# Patient Record
Sex: Male | Born: 1945 | Hispanic: No | Marital: Married | Smoking: Never smoker
Health system: Southern US, Community
[De-identification: ages and names within clinical notes are randomized; demographics above are authoritative.]

## PROBLEM LIST (undated history)

## (undated) DIAGNOSIS — I2510 Atherosclerotic heart disease of native coronary artery without angina pectoris: Secondary | ICD-10-CM

## (undated) DIAGNOSIS — E119 Type 2 diabetes mellitus without complications: Secondary | ICD-10-CM

## (undated) DIAGNOSIS — I10 Essential (primary) hypertension: Secondary | ICD-10-CM

## (undated) DIAGNOSIS — I4891 Unspecified atrial fibrillation: Secondary | ICD-10-CM

## (undated) HISTORY — DX: Essential (primary) hypertension: I10

## (undated) HISTORY — DX: Type 2 diabetes mellitus without complications: E11.9

## (undated) HISTORY — PX: OTHER SURGICAL HISTORY: SHX169

## (undated) HISTORY — DX: Atherosclerotic heart disease of native coronary artery without angina pectoris: I25.10

## (undated) HISTORY — DX: Unspecified atrial fibrillation: I48.91

---

## 2013-01-02 ENCOUNTER — Other Ambulatory Visit: Payer: Self-pay | Admitting: Family Medicine

## 2013-03-20 ENCOUNTER — Other Ambulatory Visit: Payer: Self-pay | Admitting: Family Medicine

## 2013-04-24 ENCOUNTER — Other Ambulatory Visit: Payer: Self-pay | Admitting: Family Medicine

## 2013-05-29 ENCOUNTER — Other Ambulatory Visit: Payer: Self-pay | Admitting: Family Medicine

## 2013-06-30 ENCOUNTER — Other Ambulatory Visit: Payer: Self-pay | Admitting: *Deleted

## 2013-06-30 MED ORDER — METFORMIN HCL 1000 MG PO TABS: 1000.0000 mg | ORAL_TABLET | Freq: Two times a day (BID) | ORAL | Status: DC

## 2013-06-30 MED ORDER — ATENOLOL 50 MG PO TABS: 50.0000 mg | ORAL_TABLET | Freq: Two times a day (BID) | ORAL | Status: DC

## 2013-08-10 ENCOUNTER — Telehealth: Payer: Self-pay

## 2013-08-10 MED ORDER — SILDENAFIL CITRATE 100 MG PO TABS: ORAL_TABLET | ORAL | Status: DC

## 2013-08-10 NOTE — Telephone Encounter (Signed)
Medication sent to pharmacy. Michael Velazquez was notified.

## 2013-08-10 NOTE — Telephone Encounter (Signed)
Patient needs a refill on his viagra. [facility]

## 2013-08-10 NOTE — Telephone Encounter (Signed)
Refill times one, he needs check up of his DM etc, needs OV

## 2013-09-05 ENCOUNTER — Other Ambulatory Visit: Payer: Self-pay

## 2013-09-05 MED ORDER — LISINOPRIL-HYDROCHLOROTHIAZIDE 20-25 MG PO TABS: ORAL_TABLET | ORAL | Status: DC

## 2013-09-05 MED ORDER — ATENOLOL 50 MG PO TABS: 50.0000 mg | ORAL_TABLET | Freq: Two times a day (BID) | ORAL | Status: DC

## 2013-09-05 MED ORDER — METFORMIN HCL 1000 MG PO TABS: 1000.0000 mg | ORAL_TABLET | Freq: Two times a day (BID) | ORAL | Status: DC

## 2013-09-21 ENCOUNTER — Encounter: Payer: Self-pay | Admitting: Family Medicine

## 2013-10-05 ENCOUNTER — Encounter: Payer: Self-pay | Admitting: Family Medicine

## 2013-10-05 ENCOUNTER — Ambulatory Visit (INDEPENDENT_AMBULATORY_CARE_PROVIDER_SITE_OTHER): Payer: 59 | Admitting: Family Medicine

## 2013-10-05 VITALS — BP 188/100 | Ht 68.0 in | Wt 156.0 lb

## 2013-10-05 DIAGNOSIS — Z0000 Encounter for general adult medical examination without abnormal findings: Secondary | ICD-10-CM

## 2013-10-05 DIAGNOSIS — I10 Essential (primary) hypertension: Secondary | ICD-10-CM

## 2013-10-05 DIAGNOSIS — E119 Type 2 diabetes mellitus without complications: Secondary | ICD-10-CM | POA: Insufficient documentation

## 2013-10-05 DIAGNOSIS — E785 Hyperlipidemia, unspecified: Secondary | ICD-10-CM

## 2013-10-05 DIAGNOSIS — E781 Pure hyperglyceridemia: Secondary | ICD-10-CM

## 2013-10-05 DIAGNOSIS — Z125 Encounter for screening for malignant neoplasm of prostate: Secondary | ICD-10-CM

## 2013-10-05 DIAGNOSIS — Z79899 Other long term (current) drug therapy: Secondary | ICD-10-CM

## 2013-10-05 NOTE — Progress Notes (Signed)
   Subjective:    Patient ID: Michael Velazquez, male    DOB: 04-16-1946, 68 y.o.   MRN: [id number]  HPI Patient is here today for a wellness exam.  Concerned about his stomach. He had a stomach virus 3 weeks ago, and he says his stomach has not felt the same since then.   The patient comes in today for a wellness visit.  A review of their health history was completed.  A review of medications was also completed. Any necessary refills were discussed. Sensible healthy diet was discussed. Importance of minimizing excessive salt and carbohydrates was also discussed. Safety was stressed including driving, activities at work and at home where applicable. Importance of regular physical activity for overall health was discussed. Preventative measures appropriate for age were discussed. Time was spent with the patient discussing any concerns they have about their well-being.   Review of Systems  Constitutional: Negative for fever, activity change and appetite change.  HENT: Negative for congestion and rhinorrhea.   Eyes: Negative for discharge.  Respiratory: Negative for cough and wheezing.   Cardiovascular: Negative for chest pain.  Gastrointestinal: Negative for vomiting, abdominal pain and blood in stool.  Genitourinary: Negative for frequency and difficulty urinating.  Musculoskeletal: Negative for neck pain.  Skin: Negative for rash.  Allergic/Immunologic: Negative for environmental allergies and food allergies.  Neurological: Negative for weakness and headaches.  Psychiatric/Behavioral: Negative for agitation.       Objective:   Physical Exam  Constitutional: He appears well-developed and well-nourished.  HENT:  Head: Normocephalic and atraumatic.  Right Ear: External ear normal.  Left Ear: External ear normal.  Nose: Nose normal.  Mouth/Throat: Oropharynx is clear and moist.  Eyes: EOM are normal. Pupils are equal, round, and reactive to light.  Neck: Normal range of motion.  Neck supple. No thyromegaly present.  Cardiovascular: Normal rate, regular rhythm and normal heart sounds.   No murmur heard. Pulmonary/Chest: Effort normal and breath sounds normal. No respiratory distress. He has no wheezes.  Abdominal: Soft. Bowel sounds are normal. He exhibits no distension and no mass. There is no tenderness.  Genitourinary: Prostate normal.  Musculoskeletal: Normal range of motion. He exhibits no edema.  Lymphadenopathy:    He has no cervical adenopathy.  Neurological: He is alert. He exhibits normal muscle tone.  Skin: Skin is warm and dry. No erythema.  Psychiatric: He has a normal mood and affect. His behavior is normal. Judgment normal.          Assessment & Plan:  Colonoscopy this year Vaccines DM check ups regularly labs

## 2013-10-09 ENCOUNTER — Other Ambulatory Visit: Payer: Self-pay | Admitting: *Deleted

## 2013-10-09 MED ORDER — LISINOPRIL-HYDROCHLOROTHIAZIDE 20-25 MG PO TABS: ORAL_TABLET | ORAL | Status: DC

## 2013-10-31 ENCOUNTER — Other Ambulatory Visit: Payer: Self-pay

## 2013-10-31 MED ORDER — ATENOLOL 50 MG PO TABS: 50.0000 mg | ORAL_TABLET | Freq: Two times a day (BID) | ORAL | Status: DC

## 2013-10-31 MED ORDER — METFORMIN HCL 1000 MG PO TABS: 1000.0000 mg | ORAL_TABLET | Freq: Two times a day (BID) | ORAL | Status: DC

## 2013-11-23 ENCOUNTER — Other Ambulatory Visit: Payer: Self-pay | Admitting: Nurse Practitioner

## 2013-11-23 MED ORDER — VALACYCLOVIR HCL 1 G PO TABS: 1000.0000 mg | ORAL_TABLET | Freq: Three times a day (TID) | ORAL | Status: DC

## 2013-11-23 NOTE — Progress Notes (Signed)
Phone call from patient's wife: they are on their way to the beach. Patient woke up this AM with a line of bumps/blisters along the mid buttock. Minimally tender. Will send in Rx for valtrex for probable zoster. Office visit on Monday if no better, get seen at urgent care sooner if worse.

## 2014-08-02 ENCOUNTER — Other Ambulatory Visit: Payer: Self-pay | Admitting: Family Medicine

## 2014-08-02 MED ORDER — LISINOPRIL-HYDROCHLOROTHIAZIDE 20-25 MG PO TABS: ORAL_TABLET | ORAL | Status: DC

## 2014-08-02 NOTE — Telephone Encounter (Signed)
Requesting Rx for Lisinopril to be sent to [facility].  He will make appointment for February.

## 2014-08-02 NOTE — Telephone Encounter (Signed)
Done

## 2014-09-20 ENCOUNTER — Other Ambulatory Visit: Payer: Self-pay | Admitting: *Deleted

## 2014-09-20 ENCOUNTER — Telehealth: Payer: Self-pay | Admitting: Family Medicine

## 2014-09-20 MED ORDER — LISINOPRIL-HYDROCHLOROTHIAZIDE 20-25 MG PO TABS: ORAL_TABLET | ORAL | Status: DC

## 2014-09-20 NOTE — Telephone Encounter (Signed)
Scott to see this pt has diabetes htn and all managed by [place] and not seen for a yr

## 2014-09-20 NOTE — Telephone Encounter (Signed)
Last visit 10/05/13. Pt requesting 90 day supply

## 2014-09-20 NOTE — Telephone Encounter (Signed)
Wife states he only needs enough to get to his appt. Completely out. 30 day supply sent to pharm. Wife notified.

## 2014-09-20 NOTE — Telephone Encounter (Signed)
Patient has a follow up appt on 10/12/14.  He is in need of a refill on his lisinopril.  Can we refill this?   [facility]

## 2014-10-12 ENCOUNTER — Ambulatory Visit: Payer: 59 | Admitting: Family Medicine

## 2014-10-26 ENCOUNTER — Other Ambulatory Visit: Payer: Self-pay | Admitting: *Deleted

## 2014-10-26 MED ORDER — METFORMIN HCL 1000 MG PO TABS: 1000.0000 mg | ORAL_TABLET | Freq: Two times a day (BID) | ORAL | Status: DC

## 2014-11-01 ENCOUNTER — Other Ambulatory Visit: Payer: Self-pay | Admitting: *Deleted

## 2014-11-01 DIAGNOSIS — E785 Hyperlipidemia, unspecified: Secondary | ICD-10-CM

## 2014-11-01 DIAGNOSIS — Z125 Encounter for screening for malignant neoplasm of prostate: Secondary | ICD-10-CM

## 2014-11-01 DIAGNOSIS — E119 Type 2 diabetes mellitus without complications: Secondary | ICD-10-CM

## 2014-11-01 DIAGNOSIS — Z79899 Other long term (current) drug therapy: Secondary | ICD-10-CM

## 2014-11-02 ENCOUNTER — Ambulatory Visit (INDEPENDENT_AMBULATORY_CARE_PROVIDER_SITE_OTHER): Payer: 59 | Admitting: Family Medicine

## 2014-11-02 ENCOUNTER — Encounter: Payer: Self-pay | Admitting: Family Medicine

## 2014-11-02 VITALS — BP 188/110 | Ht 68.0 in | Wt 154.0 lb

## 2014-11-02 DIAGNOSIS — Z23 Encounter for immunization: Secondary | ICD-10-CM

## 2014-11-02 DIAGNOSIS — I10 Essential (primary) hypertension: Secondary | ICD-10-CM | POA: Diagnosis not present

## 2014-11-02 DIAGNOSIS — E781 Pure hyperglyceridemia: Secondary | ICD-10-CM

## 2014-11-02 DIAGNOSIS — E119 Type 2 diabetes mellitus without complications: Secondary | ICD-10-CM | POA: Diagnosis not present

## 2014-11-02 LAB — POCT GLYCOSYLATED HEMOGLOBIN (HGB A1C): Hemoglobin A1C: 8.6

## 2014-11-02 MED ORDER — SITAGLIPTIN PHOSPHATE 100 MG PO TABS: 100.0000 mg | ORAL_TABLET | Freq: Every day | ORAL | Status: DC

## 2014-11-02 NOTE — Patient Instructions (Addendum)
Dear Patient,  It has been recommended to you that you have a colonoscopy. It is your responsibility to carry through with this recommendation.   Did you realize that colon cancer is the second leading cancer killer in the [place]. One in every 20 adults will get colon cancer. If all adults would go through the recommended screening for colon cancer (getting a colonoscopy), then there would be a 60% reduction in the number of people dying from colon cancer.  Colon cancer just doesn't come out of the blue. It starts off as a small polyp which over time grows into a cancer. A colonoscopy can prevent cancer and in many cases detected when it is at a very treatable phase. Small colon cancers can have cure rates of 95%. Advanced colon cancer, which often occurs in people who do not do their screenings, have cure rates less than 20%. The risk of colon cancer advances with age. Most adults should have regular colonoscopies every 10 years starting at age 35. This recommendation can vary depending on a person's medical history.  Health-care laws now allow for you to call the gastroenterologist office directly in order to set yourself up for this very important tests. Today we have recommended to you that you do this test. This test may save your life. Failure to do this test puts you at risk for premature death from colon cancer. Do the right thing and schedule this test now.  Here as a list of specialists we recommend in the surrounding area. When you call their office let them know that you are a patient of our practice in your interested in doing a screening colonoscopy. They should assist you without problems. You will need the following information when you called them: 1-name of which Dr. you see, 2-your insurance information, 3-a list of medications that you currently take, 4-any allergies you have to medications.  [place] gastroenterologist Dr. Milton Ferguson, Dr Felicie Morn  gastroenterologist   [facility]   [phone]  [facility] gastroenterology (Dr. Garnetta Buddy and [place]) [phone]  [facility] gastroenterology (Dr. Leia Alf, Harrietta Guardian, [place]) [phone]  Each group of specialists has assured [place] that when you called them they will help you get your colonoscopy set up. Should you have problems please let us know. Be sure to call soon. Sincerely, Michael Velazquez, Dr Mickie Hillier, Dr.Rhett Najera Michael Velazquez    Also please do eye exam this year  Have your daughter check your BP 2 to 3 times over the next few weeks and send me those readings via Beth/you may need additional medicine    Diabetes Mellitus and Food It is important for you to manage your blood sugar (glucose) level. Your blood glucose level can be greatly affected by what you eat. Eating healthier foods in the appropriate amounts throughout the day at about the same time each day will help you control your blood glucose level. It can also help slow or prevent worsening of your diabetes mellitus. Healthy eating may even help you improve the level of your blood pressure and reach or maintain a healthy weight.  HOW CAN FOOD AFFECT ME? Carbohydrates Carbohydrates affect your blood glucose level more than any other type of food. Your dietitian will help you determine how many carbohydrates to eat at each meal and teach you how to count carbohydrates. Counting carbohydrates is important to keep your blood glucose at a healthy level, especially  if you are using insulin or taking certain medicines for diabetes mellitus. Alcohol Alcohol can cause sudden decreases in blood glucose (hypoglycemia), especially if you use insulin or take certain medicines for diabetes mellitus. Hypoglycemia can be a life-threatening condition. Symptoms of hypoglycemia (sleepiness, dizziness, and disorientation) are  similar to symptoms of having too much alcohol.  If your health care provider has given you approval to drink alcohol, do so in moderation and use the following guidelines:  Women should not have more than one drink per day, and men should not have more than two drinks per day. One drink is equal to:  12 oz of beer.  5 oz of wine.  1 oz of hard liquor.  Do not drink on an empty stomach.  Keep yourself hydrated. Have water, diet soda, or unsweetened iced tea.  Regular soda, juice, and other mixers might contain a lot of carbohydrates and should be counted. WHAT FOODS ARE NOT RECOMMENDED? As you make food choices, it is important to remember that all foods are not the same. Some foods have fewer nutrients per serving than other foods, even though they might have the same number of calories or carbohydrates. It is difficult to get your body what it needs when you eat foods with fewer nutrients. Examples of foods that you should avoid that are high in calories and carbohydrates but low in nutrients include:  Trans fats (most processed foods list trans fats on the Nutrition Facts label).  Regular soda.  Juice.  Candy.  Sweets, such as cake, pie, doughnuts, and cookies.  Fried foods. WHAT FOODS CAN I EAT? Have nutrient-rich foods, which will nourish your body and keep you healthy. The food you should eat also will depend on several factors, including:  The calories you need.  The medicines you take.  Your weight.  Your blood glucose level.  Your blood pressure level.  Your cholesterol level. You also should eat a variety of foods, including:  Protein, such as meat, poultry, fish, tofu, nuts, and seeds (lean animal proteins are best).  Fruits.  Vegetables.  Dairy products, such as milk, cheese, and yogurt (low fat is best).  Breads, grains, pasta, cereal, rice, and beans.  Fats such as olive oil, trans fat-free margarine, canola oil, avocado, and olives. DOES  EVERYONE WITH DIABETES MELLITUS HAVE THE SAME MEAL PLAN? Because every person with diabetes mellitus is different, there is not one meal plan that works for everyone. It is very important that you meet with a dietitian who will help you create a meal plan that is just right for you. Document Released: 04/09/2005 Document Revised: 07/18/2013 Document Reviewed: 06/09/2013 [facility] Patient Information 2015 [place], [place]. This information is not intended to replace advice given to you by your health care provider. Make sure you discuss any questions you have with your health care provider. Hypertension Hypertension, commonly called high blood pressure, is when the force of blood pumping through your arteries is too strong. Your arteries are the blood vessels that carry blood from your heart throughout your body. A blood pressure reading consists of a higher number over a lower number, such as 110/72. The higher number (systolic) is the pressure inside your arteries when your heart pumps. The lower number (diastolic) is the pressure inside your arteries when your heart relaxes. Ideally you want your blood pressure below 120/80. Hypertension forces your heart to work harder to pump blood. Your arteries may become narrow or stiff. Having hypertension puts you at risk for  heart disease, stroke, and other problems.  RISK FACTORS Some risk factors for high blood pressure are controllable. Others are not.  Risk factors you cannot control include:   Race. You may be at higher risk if you are African American.  Age. Risk increases with age.  Gender. Men are at higher risk than women before age 78 years. After age 93, women are at higher risk than men. Risk factors you can control include:  Not getting enough exercise or physical activity.  Being overweight.  Getting too much fat, sugar, calories, or salt in your diet.  Drinking too much alcohol. SIGNS AND SYMPTOMS Hypertension does not usually cause  signs or symptoms. Extremely high blood pressure (hypertensive crisis) may cause headache, anxiety, shortness of breath, and nosebleed. DIAGNOSIS  To check if you have hypertension, your health care provider will measure your blood pressure while you are seated, with your arm held at the level of your heart. It should be measured at least twice using the same arm. Certain conditions can cause a difference in blood pressure between your right and left arms. A blood pressure reading that is higher than normal on one occasion does not mean that you need treatment. If one blood pressure reading is high, ask your health care provider about having it checked again. TREATMENT  Treating high blood pressure includes making lifestyle changes and possibly taking medicine. Living a healthy lifestyle can help lower high blood pressure. You may need to change some of your habits. Lifestyle changes may include:  Following the DASH diet. This diet is high in fruits, vegetables, and whole grains. It is low in salt, red meat, and added sugars.  Getting at least 2 hours of brisk physical activity every week.  Losing weight if necessary.  Not smoking.  Limiting alcoholic beverages.  Learning ways to reduce stress. If lifestyle changes are not enough to get your blood pressure under control, your health care provider may prescribe medicine. You may need to take more than one. Work closely with your health care provider to understand the risks and benefits. HOME CARE INSTRUCTIONS  Have your blood pressure rechecked as directed by your health care provider.   Take medicines only as directed by your health care provider. Follow the directions carefully. Blood pressure medicines must be taken as prescribed. The medicine does not work as well when you skip doses. Skipping doses also puts you at risk for problems.   Do not smoke.   Monitor your blood pressure at home as directed by your health care  provider. SEEK MEDICAL CARE IF:   You think you are having a reaction to medicines taken.  You have recurrent headaches or feel dizzy.  You have swelling in your ankles.  You have trouble with your vision. SEEK IMMEDIATE MEDICAL CARE IF:  You develop a severe headache or confusion.  You have unusual weakness, numbness, or feel faint.  You have severe chest or abdominal pain.  You vomit repeatedly.  You have trouble breathing. MAKE SURE YOU:   Understand these instructions.  Will watch your condition.  Will get help right away if you are not doing well or get worse. Document Released: 07/13/2005 Document Revised: 11/27/2013 Document Reviewed: 05/05/2013 [facility] Patient Information 2015 [place], [place]. This information is not intended to replace advice given to you by your health care provider. Make sure you discuss any questions you have with your health care provider.

## 2014-11-02 NOTE — Progress Notes (Signed)
   Subjective:    Patient ID: Michael Velazquez, male    DOB: [birth year], 69 y.o.   MRN: [id number]  Diabetes He presents for his follow-up diabetic visit. He has type 2 diabetes mellitus. There are no hypoglycemic associated symptoms. Pertinent negatives for hypoglycemia include no confusion. Associated symptoms include blurred vision and polydipsia. Pertinent negatives for diabetes include no chest pain, no fatigue, no polyphagia and no weakness. Current diabetic treatment includes oral agent (monotherapy). He is compliant with treatment some of the time. Exercise: "active" Blood glucose monitoring compliance is poor. He does not see a podiatrist.Eye exam is not current.    Cough and chest congestion on and off since Feb.  On physical exam his lungs were clear most likely postnasal drip  Review of Systems  Constitutional: Negative for activity change, appetite change and fatigue.  HENT: Negative for congestion.   Eyes: Positive for blurred vision.  Respiratory: Negative for cough.   Cardiovascular: Negative for chest pain.  Gastrointestinal: Negative for abdominal pain.  Endocrine: Positive for polydipsia. Negative for polyphagia.  Genitourinary: Positive for frequency.  Neurological: Negative for weakness.  Psychiatric/Behavioral: Negative for confusion.       Objective:   Physical Exam  Constitutional: He appears well-nourished. No distress.  Cardiovascular: Normal rate, regular rhythm and normal heart sounds.   No murmur heard. Pulmonary/Chest: Effort normal and breath sounds normal. No respiratory distress.  Musculoskeletal: He exhibits no edema.  Lymphadenopathy:    He has no cervical adenopathy.  Neurological: He is alert.  Psychiatric: His behavior is normal.  Vitals reviewed.         Assessment & Plan:  Complex issue for this patient he does not do a good job at taking good care of himself he does not come to the doctor on a regular basis I encouraged him to do a  better job he needs to get his sugar under better control  We will add Januvia  In addition to this he will follow-up in 3 months to check an A1c. He will do a better job watching how he eats and taking his medicine and being compliant  I highly recommended colonoscopy  He also drew lab work today await the results may end up needing to be on statin and other medications including 81 mg aspirin but we await the results of the tests first. I did tell the patient that failure to follow through on doing these types of things significantly increases his risk for heart disease and early death greater and 25 minutes spent with patient underwent 214

## 2014-11-03 LAB — BASIC METABOLIC PANEL
BUN/Creatinine Ratio: 19 (ref 10–22)
BUN: 19 mg/dL (ref 8–27)
CHLORIDE: 94 mmol/L — AB (ref 97–108)
CO2: 25 mmol/L (ref 18–29)
CREATININE: 0.99 mg/dL (ref 0.76–1.27)
Calcium: 9.6 mg/dL (ref 8.6–10.2)
GFR calc Af Amer: 91 mL/min/{1.73_m2} (ref >59)
GFR, EST NON AFRICAN AMERICAN: 78 mL/min/{1.73_m2} (ref >59)
Glucose: 316 mg/dL — ABNORMAL HIGH (ref 65–99)
POTASSIUM: 4.4 mmol/L (ref 3.5–5.2)
SODIUM: 136 mmol/L (ref 134–144)

## 2014-11-03 LAB — HEPATIC FUNCTION PANEL
ALK PHOS: 48 IU/L (ref 39–117)
ALT: 22 IU/L (ref 0–44)
AST: 16 IU/L (ref 0–40)
Albumin: 4.5 g/dL (ref 3.6–4.8)
BILIRUBIN TOTAL: 0.8 mg/dL (ref 0.0–1.2)
Bilirubin, Direct: 0.21 mg/dL (ref 0.00–0.40)
Total Protein: 7.1 g/dL (ref 6.0–8.5)

## 2014-11-03 LAB — MICROALBUMIN, URINE: Microalbumin, Urine: 1043 ug/mL — ABNORMAL HIGH (ref 0.0–17.0)

## 2014-11-03 LAB — LIPID PANEL
Chol/HDL Ratio: 4.3 ratio units (ref 0.0–5.0)
Cholesterol, Total: 190 mg/dL (ref 100–199)
HDL: 44 mg/dL (ref >39)
LDL Calculated: 116 mg/dL — ABNORMAL HIGH (ref 0–99)
TRIGLYCERIDES: 151 mg/dL — AB (ref 0–149)
VLDL Cholesterol Cal: 30 mg/dL (ref 5–40)

## 2014-11-03 LAB — PSA: PSA: 0.4 ng/mL (ref 0.0–4.0)

## 2014-11-04 ENCOUNTER — Encounter: Payer: Self-pay | Admitting: Family Medicine

## 2014-11-04 DIAGNOSIS — E1121 Type 2 diabetes mellitus with diabetic nephropathy: Secondary | ICD-10-CM | POA: Insufficient documentation

## 2014-11-04 DIAGNOSIS — E785 Hyperlipidemia, unspecified: Secondary | ICD-10-CM | POA: Insufficient documentation

## 2014-11-05 ENCOUNTER — Other Ambulatory Visit: Payer: Self-pay | Admitting: *Deleted

## 2014-11-05 ENCOUNTER — Other Ambulatory Visit: Payer: Self-pay

## 2014-11-05 MED ORDER — ATENOLOL 50 MG PO TABS: 50.0000 mg | ORAL_TABLET | Freq: Two times a day (BID) | ORAL | Status: DC

## 2014-11-05 MED ORDER — LISINOPRIL-HYDROCHLOROTHIAZIDE 20-25 MG PO TABS: ORAL_TABLET | ORAL | Status: DC

## 2014-11-05 MED ORDER — ATORVASTATIN CALCIUM 10 MG PO TABS: 10.0000 mg | ORAL_TABLET | Freq: Every day | ORAL | Status: DC

## 2015-02-01 ENCOUNTER — Ambulatory Visit: Payer: 59 | Admitting: Family Medicine

## 2015-03-08 ENCOUNTER — Ambulatory Visit (INDEPENDENT_AMBULATORY_CARE_PROVIDER_SITE_OTHER): Payer: 59 | Admitting: Family Medicine

## 2015-03-08 ENCOUNTER — Encounter: Payer: Self-pay | Admitting: Family Medicine

## 2015-03-08 VITALS — BP 142/86 | Ht 68.0 in | Wt 156.6 lb

## 2015-03-08 DIAGNOSIS — E785 Hyperlipidemia, unspecified: Secondary | ICD-10-CM

## 2015-03-08 DIAGNOSIS — E119 Type 2 diabetes mellitus without complications: Secondary | ICD-10-CM | POA: Diagnosis not present

## 2015-03-08 DIAGNOSIS — E1121 Type 2 diabetes mellitus with diabetic nephropathy: Secondary | ICD-10-CM

## 2015-03-08 DIAGNOSIS — E1129 Type 2 diabetes mellitus with other diabetic kidney complication: Secondary | ICD-10-CM | POA: Diagnosis not present

## 2015-03-08 DIAGNOSIS — I10 Essential (primary) hypertension: Secondary | ICD-10-CM

## 2015-03-08 LAB — POCT GLYCOSYLATED HEMOGLOBIN (HGB A1C): Hemoglobin A1C: 6.5

## 2015-03-08 MED ORDER — AMLODIPINE BESYLATE 2.5 MG PO TABS: 2.5000 mg | ORAL_TABLET | Freq: Every day | ORAL | Status: DC

## 2015-03-08 MED ORDER — ATORVASTATIN CALCIUM 10 MG PO TABS: 10.0000 mg | ORAL_TABLET | Freq: Every day | ORAL | Status: DC

## 2015-03-08 MED ORDER — LISINOPRIL-HYDROCHLOROTHIAZIDE 20-25 MG PO TABS: ORAL_TABLET | ORAL | Status: DC

## 2015-03-08 MED ORDER — METFORMIN HCL 1000 MG PO TABS: 1000.0000 mg | ORAL_TABLET | Freq: Two times a day (BID) | ORAL | Status: DC

## 2015-03-08 NOTE — Progress Notes (Signed)
   Subjective:    Patient ID: Michael Velazquez, male    DOB: 09/15/1945, 70 y.o.   MRN: [id number]  Diabetes He presents for his follow-up diabetic visit. He has type 2 diabetes mellitus. Risk factors for coronary artery disease include dyslipidemia, diabetes mellitus and hypertension. Current diabetic treatment includes oral agent (dual therapy). He is compliant with treatment all of the time. He has not had a previous visit with a dietitian. He does not see a podiatrist.Eye exam is not current.   Patient has not done colonoscopy the rationale to get it done was discussed Patient was told to see eye doctor on a regular basis Patient was counseled regarding proper eating watching fats in diet keep sugars under good control taking medications Patient relates he is taking his blood pressure medicine on a regular basis try to watch and be more active   Review of Systems    patient denies any chest tightness pressure pain denies rectal bleeding denies dysphagia. Denies any chest discomfort no pressure pain. Denies excessive thirst or urination Objective:   Physical Exam  Lungs clear heart regular pulse normal BP mildly elevated abdomen soft extremities no edema diabetic foot exam normal      Assessment & Plan:  1. Diabetes mellitus without complication Much better control continue diet measures continue medications - POCT glycosylated hemoglobin (Hb A1C)  2. Essential hypertension Blood pressure subpar add amlodipine 2.5 mg daily - Hepatic function panel  3. Type 2 diabetes mellitus without complication See above  4. Diabetic nephropathy with proteinuria Importance keeping blood pressure under good control ideally shooting for 120/70 [place] was discussed  5. Hyperlipidemia History hyperlipidemia continue medication check lipid profile await the results  Encourage patient to get colonoscopy Greater than 25 minutes spent with patient discussing multiple health issues - Lipid  panel

## 2015-03-09 LAB — HEPATIC FUNCTION PANEL
ALT: 21 IU/L (ref 0–44)
AST: 18 IU/L (ref 0–40)
Albumin: 4.3 g/dL (ref 3.6–4.8)
Alkaline Phosphatase: 37 IU/L — ABNORMAL LOW (ref 39–117)
Bilirubin Total: 0.9 mg/dL (ref 0.0–1.2)
Bilirubin, Direct: 0.24 mg/dL (ref 0.00–0.40)
Total Protein: 7.1 g/dL (ref 6.0–8.5)

## 2015-03-09 LAB — LIPID PANEL
CHOL/HDL RATIO: 2.9 ratio (ref 0.0–5.0)
CHOLESTEROL TOTAL: 111 mg/dL (ref 100–199)
HDL: 38 mg/dL — ABNORMAL LOW (ref >39)
LDL CALC: 54 mg/dL (ref 0–99)
Triglycerides: 95 mg/dL (ref 0–149)
VLDL Cholesterol Cal: 19 mg/dL (ref 5–40)

## 2015-03-10 ENCOUNTER — Encounter: Payer: Self-pay | Admitting: Family Medicine

## 2015-07-04 ENCOUNTER — Other Ambulatory Visit: Payer: Self-pay

## 2015-07-04 DIAGNOSIS — E119 Type 2 diabetes mellitus without complications: Secondary | ICD-10-CM

## 2015-07-04 DIAGNOSIS — Z79899 Other long term (current) drug therapy: Secondary | ICD-10-CM

## 2015-07-04 DIAGNOSIS — E785 Hyperlipidemia, unspecified: Secondary | ICD-10-CM

## 2015-07-04 DIAGNOSIS — Z125 Encounter for screening for malignant neoplasm of prostate: Secondary | ICD-10-CM

## 2015-07-05 ENCOUNTER — Encounter: Payer: Self-pay | Admitting: Family Medicine

## 2015-07-05 ENCOUNTER — Ambulatory Visit (INDEPENDENT_AMBULATORY_CARE_PROVIDER_SITE_OTHER): Payer: 59 | Admitting: Family Medicine

## 2015-07-05 VITALS — BP 180/100 | Ht 68.0 in | Wt 157.0 lb

## 2015-07-05 DIAGNOSIS — E119 Type 2 diabetes mellitus without complications: Secondary | ICD-10-CM

## 2015-07-05 DIAGNOSIS — E785 Hyperlipidemia, unspecified: Secondary | ICD-10-CM | POA: Diagnosis not present

## 2015-07-05 DIAGNOSIS — I10 Essential (primary) hypertension: Secondary | ICD-10-CM

## 2015-07-05 DIAGNOSIS — Z23 Encounter for immunization: Secondary | ICD-10-CM | POA: Diagnosis not present

## 2015-07-05 MED ORDER — AMLODIPINE BESYLATE 5 MG PO TABS: 5.0000 mg | ORAL_TABLET | Freq: Every day | ORAL | Status: DC

## 2015-07-05 NOTE — Progress Notes (Signed)
   Subjective:    Patient ID: Michael Velazquez, male    DOB: August 14, 1945, 69 y.o.   MRN: [id number]  Diabetes He presents for his follow-up diabetic visit. He has type 2 diabetes mellitus. Pertinent negatives for hypoglycemia include no confusion. Pertinent negatives for diabetes include no chest pain, no fatigue, no polydipsia, no polyphagia and no weakness. He is compliant with treatment all of the time. He is following a generally healthy diet. Exercise: stays active, works in the yard all the time. His breakfast blood glucose range is generally 140-180 mg/dl. He does not see a podiatrist.Eye exam is not current (has appt in 2 weeks).   A1C done on bloodwork this am.   Flu vaccine.   Patient denies chest tightness pressure pain he wrote relates compliance with his medications does take his medicines on a regular basis. 81 mg aspirin added today. Risk of bleeding was discussed.  Review of Systems  Constitutional: Negative for activity change, appetite change and fatigue.  HENT: Negative for congestion.   Respiratory: Negative for cough.   Cardiovascular: Negative for chest pain.  Gastrointestinal: Negative for abdominal pain.  Endocrine: Negative for polydipsia and polyphagia.  Neurological: Negative for weakness.  Psychiatric/Behavioral: Negative for confusion.       Objective:   Physical Exam  Constitutional: He appears well-nourished. No distress.  Cardiovascular: Normal rate, regular rhythm and normal heart sounds.   No murmur heard. Pulmonary/Chest: Effort normal and breath sounds normal. No respiratory distress.  Musculoskeletal: He exhibits no edema.  Lymphadenopathy:    He has no cervical adenopathy.  Neurological: He is alert.  Psychiatric: His behavior is normal.  Vitals reviewed.         Assessment & Plan:  Diabetes-await the results of his recent blood work.   Elevated blood pressure increase amlodipine his daughter will check his blood pressure  periodically her send [place] readings.  Colonoscopy recommended that patient has a lot of other preventative measures coming up in then he hopes to be able to get that completed by early next year

## 2015-07-05 NOTE — Patient Instructions (Signed)
Aspirin and Your Heart  Aspirin is a medicine that affects the way blood clots. Aspirin can be used to help reduce the risk of blood clots, heart attacks, and other heart-related problems.  SHOULD I TAKE ASPIRIN? Your health care provider will help you determine whether it is safe and beneficial for you to take aspirin daily. Taking aspirin daily may be beneficial if you:  Have had a heart attack or chest pain.  Have undergone open heart surgery such as coronary artery bypass surgery (CABG).  Have had coronary angioplasty.  Have experienced a stroke or transient ischemic attack (TIA).  Have peripheral vascular disease (PVD).  Have chronic heart rhythm problems such as atrial fibrillation. ARE THERE ANY RISKS OF TAKING ASPIRIN DAILY? Daily use of aspirin can increase your risk of side effects. Some of these include:  Bleeding. Bleeding problems can be minor or serious. An example of a minor problem is a cut that does not stop bleeding. An example of a more serious problem is stomach bleeding or bleeding into the brain. Your risk of bleeding is increased if you are also taking non-steroidal anti-inflammatory medicine (NSAIDs).  Increased bruising.  Upset stomach.  An allergic reaction. People who have nasal polyps have an increased risk of developing an aspirin allergy. WHAT ARE SOME GUIDELINES I SHOULD FOLLOW WHEN TAKING ASPIRIN?   Take aspirin only as directed by your health care provider. Make sure you understand how much you should take and what form you should take. The two forms of aspirin are:  Non-enteric-coated. This type of aspirin does not have a coating and is absorbed quickly. Non-enteric-coated aspirin is usually recommended for people with chest pain. This type of aspirin also comes in a chewable form.  Enteric-coated. This type of aspirin has a special coating that releases the medicine very slowly. Enteric-coated aspirin causes less stomach upset than non-enteric-coated  aspirin. This type of aspirin should not be chewed or crushed.  Drink alcohol in moderation. Drinking alcohol increases your risk of bleeding. WHEN SHOULD I SEEK MEDICAL CARE?   You have unusual bleeding or bruising.  You have stomach pain.  You have an allergic reaction. Symptoms of an allergic reaction include:  Hives.  Itchy skin.  Swelling of the lips, tongue, or face.  You have ringing in your ears. WHEN SHOULD I SEEK IMMEDIATE MEDICAL CARE?   Your bowel movements are bloody, dark red, or black in color.  You vomit or cough up blood.  You have blood in your urine.  You cough, wheeze, or feel short of breath. If you have any of the following symptoms, this is an emergency. Do not wait to see if the pain will go away. Get medical help at once. Call your local emergency services (911 in the [place] Do not drive yourself to the hospital.  You have severe chest pain, especially if the pain is crushing or pressure-like and spreads to the arms, back, neck, or jaw.  You have stroke-like symptoms, such as:   Loss of vision.   Difficulty talking.   Numbness or weakness on one side of your body.   Numbness or weakness in your arm or leg.   Not thinking clearly or feeling confused.    This information is not intended to replace advice given to you by your health care provider. Make sure you discuss any questions you have with your health care provider.   Document Released: 06/25/2008 Document Revised: 08/03/2014 Document Reviewed: 10/18/2013 Elsevier Interactive Patient Education 2016 Elsevier   Inc.  

## 2015-07-06 LAB — HEPATIC FUNCTION PANEL
ALBUMIN: 4.3 g/dL (ref 3.6–4.8)
ALT: 22 IU/L (ref 0–44)
AST: 18 IU/L (ref 0–40)
Alkaline Phosphatase: 42 IU/L (ref 39–117)
Bilirubin Total: 0.9 mg/dL (ref 0.0–1.2)
Bilirubin, Direct: 0.25 mg/dL (ref 0.00–0.40)
Total Protein: 6.9 g/dL (ref 6.0–8.5)

## 2015-07-06 LAB — BASIC METABOLIC PANEL
BUN / CREAT RATIO: 22 (ref 10–22)
BUN: 20 mg/dL (ref 8–27)
CALCIUM: 9.7 mg/dL (ref 8.6–10.2)
CHLORIDE: 96 mmol/L — AB (ref 97–106)
CO2: 26 mmol/L (ref 18–29)
Creatinine, Ser: 0.92 mg/dL (ref 0.76–1.27)
GFR calc Af Amer: 98 mL/min/{1.73_m2} (ref >59)
GFR calc non Af Amer: 85 mL/min/{1.73_m2} (ref >59)
Glucose: 231 mg/dL — ABNORMAL HIGH (ref 65–99)
POTASSIUM: 4.5 mmol/L (ref 3.5–5.2)
Sodium: 137 mmol/L (ref 136–144)

## 2015-07-06 LAB — LIPID PANEL
CHOLESTEROL TOTAL: 112 mg/dL (ref 100–199)
Chol/HDL Ratio: 3.1 ratio units (ref 0.0–5.0)
HDL: 36 mg/dL — ABNORMAL LOW (ref >39)
LDL Calculated: 59 mg/dL (ref 0–99)
Triglycerides: 85 mg/dL (ref 0–149)
VLDL CHOLESTEROL CAL: 17 mg/dL (ref 5–40)

## 2015-07-06 LAB — HEMOGLOBIN A1C
Est. average glucose Bld gHb Est-mCnc: 206 mg/dL
Hgb A1c MFr Bld: 8.8 % — ABNORMAL HIGH (ref 4.8–5.6)

## 2015-07-06 LAB — PSA: PROSTATE SPECIFIC AG, SERUM: 0.3 ng/mL (ref 0.0–4.0)

## 2015-07-16 LAB — HM DIABETES EYE EXAM

## 2015-07-19 ENCOUNTER — Encounter: Payer: Self-pay | Admitting: *Deleted

## 2015-10-16 MED FILL — ATORVASTATIN 10 MG TABLET: 10 | 90 days supply | Qty: 90 | Fill #1

## 2015-10-16 MED FILL — AMLODIPINE BESYLATE 5 MG TA: 5 | 90 days supply | Qty: 90 | Fill #1

## 2015-10-31 ENCOUNTER — Other Ambulatory Visit: Payer: Self-pay | Admitting: Family Medicine

## 2015-10-31 MED FILL — LISINOPRIL-HCTZ 20-25 MG TA: 20-25 | 90 days supply | Qty: 90 | Fill #2

## 2015-10-31 MED FILL — JANUVIA 100 MG TABLET: 100 | 30 days supply | Qty: 30 | Fill #0

## 2015-11-29 ENCOUNTER — Ambulatory Visit: Payer: 59 | Admitting: Family Medicine

## 2015-12-09 ENCOUNTER — Telehealth: Payer: Self-pay | Admitting: Family Medicine

## 2015-12-09 MED ORDER — ATENOLOL 50 MG PO TABS: 50.0000 mg | ORAL_TABLET | Freq: Two times a day (BID) | ORAL | Status: DC

## 2015-12-09 MED ORDER — METFORMIN HCL 1000 MG PO TABS: 1000.0000 mg | ORAL_TABLET | Freq: Two times a day (BID) | ORAL | Status: DC

## 2015-12-09 NOTE — Telephone Encounter (Signed)
Requesting Rx for Metformin and Atenolol 50 mg.    [facility]  Patient has a followup appt on 12/24/15.

## 2015-12-09 NOTE — Telephone Encounter (Signed)
Rx sent electronically to pharmacy. Patient notified. 

## 2015-12-20 ENCOUNTER — Telehealth: Payer: Self-pay | Admitting: Family Medicine

## 2015-12-20 NOTE — Telephone Encounter (Signed)
Notified Beth that a 90 day supply of this med was sent in on 12/09/15 to [facility].

## 2015-12-20 NOTE — Telephone Encounter (Signed)
atenolol (TENORMIN) 50 MG tablet  Please send refill to [facility], he is out as of today He does have an appt on Tuesday next week for his check up

## 2015-12-24 ENCOUNTER — Encounter: Payer: Self-pay | Admitting: Family Medicine

## 2015-12-24 ENCOUNTER — Ambulatory Visit (INDEPENDENT_AMBULATORY_CARE_PROVIDER_SITE_OTHER): Payer: Medicare Other | Admitting: Family Medicine

## 2015-12-24 VITALS — BP 136/86 | Ht 68.0 in | Wt 157.4 lb

## 2015-12-24 DIAGNOSIS — E119 Type 2 diabetes mellitus without complications: Secondary | ICD-10-CM

## 2015-12-24 DIAGNOSIS — I10 Essential (primary) hypertension: Secondary | ICD-10-CM | POA: Diagnosis not present

## 2015-12-24 DIAGNOSIS — L570 Actinic keratosis: Secondary | ICD-10-CM

## 2015-12-24 DIAGNOSIS — E1121 Type 2 diabetes mellitus with diabetic nephropathy: Secondary | ICD-10-CM | POA: Diagnosis not present

## 2015-12-24 DIAGNOSIS — E785 Hyperlipidemia, unspecified: Secondary | ICD-10-CM

## 2015-12-24 LAB — POCT GLYCOSYLATED HEMOGLOBIN (HGB A1C): Hemoglobin A1C: 8.2

## 2015-12-24 MED ORDER — SITAGLIPTIN PHOSPHATE 100 MG PO TABS: 100.0000 mg | ORAL_TABLET | Freq: Every day | ORAL | Status: DC

## 2015-12-24 MED ORDER — ATENOLOL 50 MG PO TABS: 50.0000 mg | ORAL_TABLET | Freq: Two times a day (BID) | ORAL | Status: DC

## 2015-12-24 MED ORDER — LISINOPRIL-HYDROCHLOROTHIAZIDE 20-25 MG PO TABS: ORAL_TABLET | ORAL | Status: DC

## 2015-12-24 MED ORDER — AMLODIPINE BESYLATE 5 MG PO TABS: 5.0000 mg | ORAL_TABLET | Freq: Every day | ORAL | Status: DC

## 2015-12-24 MED ORDER — METFORMIN HCL 1000 MG PO TABS: 1000.0000 mg | ORAL_TABLET | Freq: Two times a day (BID) | ORAL | Status: DC

## 2015-12-24 MED ORDER — ATORVASTATIN CALCIUM 10 MG PO TABS: 10.0000 mg | ORAL_TABLET | Freq: Every day | ORAL | Status: DC

## 2015-12-24 MED ORDER — GLIPIZIDE ER 2.5 MG PO TB24: 2.5000 mg | ORAL_TABLET | Freq: Every day | ORAL | Status: DC

## 2015-12-24 NOTE — Progress Notes (Signed)
Subjective:    Patient ID: Michael Velazquez, male    DOB: 02/01/1946, 70 y.o.   MRN: [id number]  Diabetes He presents for his follow-up diabetic visit. He has type 2 diabetes mellitus. There are no hypoglycemic associated symptoms. Pertinent negatives for hypoglycemia include no confusion. There are no diabetic associated symptoms. Pertinent negatives for diabetes include no chest pain, no fatigue, no polydipsia, no polyphagia and no weakness. There are no hypoglycemic complications. There are no diabetic complications. There are no known risk factors for coronary artery disease. Current diabetic treatment includes oral agent (dual therapy). He is compliant with treatment all of the time.   Results for orders placed or performed in visit on 12/24/15  POCT glycosylated hemoglobin (Hb A1C)  Result Value Ref Range   Hemoglobin A1C 8.2     Patient states that he has a scab on the top of his head. Onset about 2-3 weeks ago. Treatments tried: none.  Medications were reviewed the importance of compliance discussed importance of better dietary measures talked about as well as improving physical activity Patient denies any cardiac symptoms.  Review of Systems  Constitutional: Negative for activity change, appetite change and fatigue.  HENT: Negative for congestion.   Respiratory: Negative for cough.   Cardiovascular: Negative for chest pain.  Gastrointestinal: Negative for abdominal pain.  Endocrine: Negative for polydipsia and polyphagia.  Neurological: Negative for weakness.  Psychiatric/Behavioral: Negative for confusion.       Objective:   Physical Exam  Constitutional: He appears well-nourished. No distress.  Cardiovascular: Normal rate, regular rhythm and normal heart sounds.   No murmur heard. Pulmonary/Chest: Effort normal and breath sounds normal. No respiratory distress.  Musculoskeletal: He exhibits no edema.  Lymphadenopathy:    He has no cervical adenopathy.    Neurological: He is alert.  Psychiatric: His behavior is normal.  Vitals reviewed.   Patient has what appears to be actinic keratosis on top of his head Diabetic foot exam completed      Assessment & Plan:  1. Type 2 diabetes mellitus without complication, without long-term current use of insulin ([place]) The patient was seen today as part of a comprehensive visit for diabetes. The importance of keeping her A1c at or below 7 was discussed. Importance of regular physical activity was discussed. Proper monitoring of glucose levels with glucometer discussed. The importance of adherence to medication as well as a controlled low starch/sugar diet was also discussed. Also discussion regarding the importance of diabetic foot checks including self check every day. Also yearly diabetic eye exams recommended. The importance of keeping blood pressure under control and keeping LDL below 100 was also discussed. Also the importance of avoiding smoking. Standard follow-up visit recommended. Finally failure to follow good diabetic measures including self effort and compliance with recommendations can certainly increase the risk of heart disease strokes kidney failure blindness loss of limb and early death was discussed with the patient. A1c needs to be better we will add glipizide 2.5 mg the cost the Januvia is difficult but they are managing currently recheck lab work in the yearly fall with A1c - POCT glycosylated hemoglobin (Hb A1C) - Lipid panel - Hepatic function panel - Basic metabolic panel  2. Essential hypertension HTN- Patient was seen today as part of a visit regarding hypertension. The importance of healthy diet and regular physical activity was discussed. The importance of compliance with medications discussed. Ideal goal is to keep blood pressure low elevated levels certainly below [id number] when possible. The  patient was counseled that keeping blood pressure under control lessen his risk of heart  attack, stroke, kidney failure, and early death. The importance of regular follow-ups was discussed with the patient. Low-salt diet such as DASH recommended. Regular physical activity was recommended as well. Patient was advised to keep regular follow-ups. Blood pressure good control today regular physical activity recommended - Lipid panel - Hepatic function panel - Basic metabolic panel  3. Hyperlipidemia The patient was seen today as part of an evaluation regarding hyperlipidemia. Recent lab work has been reviewed with the patient as well as the goals for good cholesterol care. In addition to this medications have been discussed the importance of compliance with diet and medications discussed as well. Patient has been informed of potential side effects of medications in the importance to notify us should any problems occur. Finally the patient is aware that poor control of cholesterol, noncompliance can dramatically increase her risk of heart attack strokes and premature death. The patient will keep regular office visits and the patient does agreed to periodic lab work. Lab work ordered previous lab work results reviewed - Lipid panel - Hepatic function panel - Basic metabolic panel  4. Diabetic nephropathy with proteinuria (HCC) Importance keeping blood pressure under good control on getting A1c down discuss - Lipid panel - Hepatic function panel - Basic metabolic panel  5. Actinic keratosis Referral to dermatology for possible freezing or biopsy - Lipid panel - Hepatic function panel - Basic metabolic panel - Ambulatory referral to Dermatology

## 2015-12-25 ENCOUNTER — Encounter: Payer: Self-pay | Admitting: Family Medicine

## 2015-12-25 LAB — BASIC METABOLIC PANEL
BUN / CREAT RATIO: 23 (ref 10–24)
BUN: 27 mg/dL (ref 8–27)
CALCIUM: 10 mg/dL (ref 8.6–10.2)
CHLORIDE: 95 mmol/L — AB (ref 96–106)
CO2: 25 mmol/L (ref 18–29)
CREATININE: 1.16 mg/dL (ref 0.76–1.27)
GFR, EST AFRICAN AMERICAN: 74 mL/min/{1.73_m2} (ref >59)
GFR, EST NON AFRICAN AMERICAN: 64 mL/min/{1.73_m2} (ref >59)
Glucose: 277 mg/dL — ABNORMAL HIGH (ref 65–99)
Potassium: 4.7 mmol/L (ref 3.5–5.2)
Sodium: 138 mmol/L (ref 134–144)

## 2015-12-25 LAB — HEPATIC FUNCTION PANEL
ALBUMIN: 4.4 g/dL (ref 3.6–4.8)
ALT: 20 IU/L (ref 0–44)
AST: 22 IU/L (ref 0–40)
Alkaline Phosphatase: 45 IU/L (ref 39–117)
BILIRUBIN TOTAL: 0.7 mg/dL (ref 0.0–1.2)
BILIRUBIN, DIRECT: 0.24 mg/dL (ref 0.00–0.40)
TOTAL PROTEIN: 7.3 g/dL (ref 6.0–8.5)

## 2015-12-25 LAB — LIPID PANEL
CHOL/HDL RATIO: 3.1 ratio (ref 0.0–5.0)
Cholesterol, Total: 117 mg/dL (ref 100–199)
HDL: 38 mg/dL — ABNORMAL LOW (ref >39)
LDL CALC: 55 mg/dL (ref 0–99)
Triglycerides: 120 mg/dL (ref 0–149)
VLDL Cholesterol Cal: 24 mg/dL (ref 5–40)

## 2016-01-07 DIAGNOSIS — L570 Actinic keratosis: Secondary | ICD-10-CM | POA: Diagnosis not present

## 2016-01-07 DIAGNOSIS — D044 Carcinoma in situ of skin of scalp and neck: Secondary | ICD-10-CM | POA: Diagnosis not present

## 2016-01-07 DIAGNOSIS — D0439 Carcinoma in situ of skin of other parts of face: Secondary | ICD-10-CM | POA: Diagnosis not present

## 2016-03-05 DIAGNOSIS — D0439 Carcinoma in situ of skin of other parts of face: Secondary | ICD-10-CM | POA: Diagnosis not present

## 2016-03-05 DIAGNOSIS — D044 Carcinoma in situ of skin of scalp and neck: Secondary | ICD-10-CM | POA: Diagnosis not present

## 2016-05-21 ENCOUNTER — Ambulatory Visit: Payer: Medicare Other | Admitting: Family Medicine

## 2016-05-28 DIAGNOSIS — T63441A Toxic effect of venom of bees, accidental (unintentional), initial encounter: Secondary | ICD-10-CM | POA: Diagnosis not present

## 2016-05-28 DIAGNOSIS — L500 Allergic urticaria: Secondary | ICD-10-CM | POA: Diagnosis not present

## 2016-05-28 DIAGNOSIS — R0602 Shortness of breath: Secondary | ICD-10-CM | POA: Diagnosis not present

## 2016-05-28 DIAGNOSIS — R0902 Hypoxemia: Secondary | ICD-10-CM | POA: Diagnosis not present

## 2016-05-28 DIAGNOSIS — T782XXA Anaphylactic shock, unspecified, initial encounter: Secondary | ICD-10-CM | POA: Diagnosis not present

## 2016-06-24 ENCOUNTER — Encounter: Payer: Self-pay | Admitting: Family Medicine

## 2016-06-24 ENCOUNTER — Ambulatory Visit (INDEPENDENT_AMBULATORY_CARE_PROVIDER_SITE_OTHER): Payer: Medicare Other | Admitting: Family Medicine

## 2016-06-24 VITALS — BP 148/92 | Ht 68.0 in | Wt 158.4 lb

## 2016-06-24 DIAGNOSIS — E119 Type 2 diabetes mellitus without complications: Secondary | ICD-10-CM | POA: Diagnosis not present

## 2016-06-24 DIAGNOSIS — E784 Other hyperlipidemia: Secondary | ICD-10-CM

## 2016-06-24 DIAGNOSIS — Z125 Encounter for screening for malignant neoplasm of prostate: Secondary | ICD-10-CM

## 2016-06-24 DIAGNOSIS — I10 Essential (primary) hypertension: Secondary | ICD-10-CM | POA: Diagnosis not present

## 2016-06-24 DIAGNOSIS — S40011A Contusion of right shoulder, initial encounter: Secondary | ICD-10-CM | POA: Diagnosis not present

## 2016-06-24 DIAGNOSIS — Z23 Encounter for immunization: Secondary | ICD-10-CM | POA: Diagnosis not present

## 2016-06-24 DIAGNOSIS — E7849 Other hyperlipidemia: Secondary | ICD-10-CM

## 2016-06-24 LAB — POCT GLYCOSYLATED HEMOGLOBIN (HGB A1C): Hemoglobin A1C: 6.9

## 2016-06-24 MED ORDER — GLIPIZIDE ER 2.5 MG PO TB24: 2.5000 mg | ORAL_TABLET | Freq: Every day | ORAL | 1 refills | Status: DC

## 2016-06-24 MED ORDER — LISINOPRIL-HYDROCHLOROTHIAZIDE 20-25 MG PO TABS: ORAL_TABLET | ORAL | 2 refills | Status: DC

## 2016-06-24 MED ORDER — AMLODIPINE BESYLATE 5 MG PO TABS: 5.0000 mg | ORAL_TABLET | Freq: Every day | ORAL | 1 refills | Status: DC

## 2016-06-24 MED ORDER — SITAGLIPTIN PHOSPHATE 100 MG PO TABS: 100.0000 mg | ORAL_TABLET | Freq: Every day | ORAL | 1 refills | Status: DC

## 2016-06-24 MED ORDER — METFORMIN HCL 1000 MG PO TABS: 1000.0000 mg | ORAL_TABLET | Freq: Two times a day (BID) | ORAL | 1 refills | Status: DC

## 2016-06-24 MED ORDER — ATENOLOL 50 MG PO TABS: 50.0000 mg | ORAL_TABLET | Freq: Two times a day (BID) | ORAL | 1 refills | Status: DC

## 2016-06-24 MED ORDER — SILDENAFIL CITRATE 20 MG PO TABS: ORAL_TABLET | ORAL | 3 refills | Status: DC

## 2016-06-24 MED ORDER — ATORVASTATIN CALCIUM 10 MG PO TABS: 10.0000 mg | ORAL_TABLET | Freq: Every day | ORAL | 1 refills | Status: DC

## 2016-06-24 NOTE — Patient Instructions (Signed)
Dear Patient,  It has been recommended to you that you have a colonoscopy. It is your responsibility to carry through with this recommendation.   Did you realize that colon cancer is the second leading cancer killer in the [place]. One in every 20 adults will get colon cancer. If all adults would go through the recommended screening for colon cancer (getting a colonoscopy), then there would be a 60% reduction in the number of people dying from colon cancer.  Colon cancer just doesn't come out of the blue. It starts off as a small polyp which over time grows into a cancer. A colonoscopy can prevent cancer and in many cases detected when it is at a very treatable phase. Small colon cancers can have cure rates of 95%. Advanced colon cancer, which often occurs in people who do not do their screenings, have cure rates less than 20%. The risk of colon cancer advances with age. Most adults should have regular colonoscopies every 10 years starting at age 50. This recommendation can vary depending on a person's medical history.  Health-care laws now allow for you to call the gastroenterologist office directly in order to set yourself up for this very important tests. Today we have recommended to you that you do this test. This test may save your life. Failure to do this test puts you at risk for premature death from colon cancer. Do the right thing and schedule this test now.  Here as a list of specialists we recommend in the surrounding area. When you call their office let them know that you are a patient of our practice in your interested in doing a screening colonoscopy. They should assist you without problems. You will need the following information when you called them: 1-name of which Dr. you see, 2-your insurance information, 3-a list of medications that you currently take, 4-any allergies you have to medications.  [place] gastroenterologist Dr. Mike Rourk, Dr Sandi Fields   [place]  gastroenterologist   [phone]  Dr.Najeeb Rehman [facility]   [phone]   gastroenterology [facility] (Dr. Perry, N, Stark, Brodie, Gesner, Jacobs and Pyrtle) [phone]  [facility] (Dr. Buscemi, Edwards, Hayes, [place]law,Schooler) [phone]  Each group of specialists has assured us that when you called them they will help you get your colonoscopy set up. Should you have problems or if the GI practice insist a referral be done please let us know. Be sure to call soon. Sincerely, Carolyn Hoskins, Dr Steve Luking, Dr.Scott Luking    

## 2016-06-24 NOTE — Progress Notes (Signed)
Discussed with pt. Verbalized understanding.

## 2016-06-24 NOTE — Progress Notes (Signed)
   Subjective:    Patient ID: Michael Velazquez, male    DOB: 1945-08-26, 70 y.o.   MRN: [id number]  Diabetes  He presents for his follow-up diabetic visit. He has type 2 diabetes mellitus. Pertinent negatives for hypoglycemia include no confusion. Pertinent negatives for diabetes include no chest pain, no fatigue, no polydipsia, no polyphagia and no weakness. Risk factors for coronary artery disease include diabetes mellitus, dyslipidemia and hypertension. Current diabetic treatment includes oral agent (triple therapy). He is compliant with treatment all of the time. His weight is stable. He is following a diabetic diet. He has not had a previous visit with a dietitian. He does not see a podiatrist.Eye exam is not current.  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  HENT: Negative for congestion.   Respiratory: Negative for cough.   Cardiovascular: Negative for chest pain.  Gastrointestinal: Negative for abdominal pain.  Endocrine: Negative for polydipsia and polyphagia.  Musculoskeletal: Positive for arthralgias.  Neurological: Negative for weakness.  Psychiatric/Behavioral: Negative for confusion.       Objective:   Physical Exam    Constitutional: He appears well-nourished. No distress.  Cardiovascular: Normal rate, regular rhythm and normal heart sounds.   No murmur heard. Pulmonary/Chest: Effort normal and breath sounds normal. No respiratory distress.  Musculoskeletal: He exhibits tenderness. He exhibits no edema.  Lymphadenopathy:    He has no cervical adenopathy.  Neurological: He is alert.  Psychiatric: His behavior is normal.  Vitals reviewed.  The patient has significant tenderness in the proximal humerus he also has significant limited range of motion of the right shoulder. He also has rotator cuff related problems. I told the patient at the minimum he needs x-rays as well as exercise and if he doesn't improve with that referral to orthopedics       Assessment & Plan:  #1 diabetes good control continue current measures Blood pressure fair control watch diet closely blood pressure may be up today because of his pain recheck blood pressure in the near future Hyperlipidemia previous labs reviewed continue current measures Office related hypertension as well as hypertension continue medication follow-up for blood pressure check in several weeks courtesy. In addition to this check blood pressure readings at home and follow-up accordingly. Right shoulder injury this happened at work he will consider get an x-ray I wrote him a note to do so. He will call us if he needs our help set this up otherwise he may get it through his workplace Lab work ordered await the results  Mild erectile dysfunction may try sildenafil

## 2016-11-26 ENCOUNTER — Other Ambulatory Visit: Payer: Self-pay | Admitting: Family Medicine

## 2016-12-18 ENCOUNTER — Ambulatory Visit: Payer: Medicare Other | Admitting: Family Medicine

## 2017-01-21 ENCOUNTER — Ambulatory Visit (INDEPENDENT_AMBULATORY_CARE_PROVIDER_SITE_OTHER): Payer: Medicare Other | Admitting: Family Medicine

## 2017-01-21 ENCOUNTER — Encounter: Payer: Self-pay | Admitting: Family Medicine

## 2017-01-21 VITALS — BP 132/88 | Ht 68.0 in | Wt 155.0 lb

## 2017-01-21 DIAGNOSIS — B070 Plantar wart: Secondary | ICD-10-CM

## 2017-01-21 DIAGNOSIS — E1142 Type 2 diabetes mellitus with diabetic polyneuropathy: Secondary | ICD-10-CM

## 2017-01-21 DIAGNOSIS — E119 Type 2 diabetes mellitus without complications: Secondary | ICD-10-CM | POA: Diagnosis not present

## 2017-01-21 DIAGNOSIS — Z125 Encounter for screening for malignant neoplasm of prostate: Secondary | ICD-10-CM

## 2017-01-21 DIAGNOSIS — D692 Other nonthrombocytopenic purpura: Secondary | ICD-10-CM | POA: Diagnosis not present

## 2017-01-21 DIAGNOSIS — E7849 Other hyperlipidemia: Secondary | ICD-10-CM

## 2017-01-21 DIAGNOSIS — M25542 Pain in joints of left hand: Secondary | ICD-10-CM | POA: Diagnosis not present

## 2017-01-21 DIAGNOSIS — E784 Other hyperlipidemia: Secondary | ICD-10-CM | POA: Diagnosis not present

## 2017-01-21 DIAGNOSIS — I10 Essential (primary) hypertension: Secondary | ICD-10-CM

## 2017-01-21 DIAGNOSIS — L989 Disorder of the skin and subcutaneous tissue, unspecified: Secondary | ICD-10-CM | POA: Diagnosis not present

## 2017-01-21 DIAGNOSIS — Z79899 Other long term (current) drug therapy: Secondary | ICD-10-CM

## 2017-01-21 LAB — POCT GLYCOSYLATED HEMOGLOBIN (HGB A1C): HEMOGLOBIN A1C: 6.4

## 2017-01-21 MED ORDER — KETOCONAZOLE 2 % EX CREA: 1.0000 "application " | TOPICAL_CREAM | Freq: Two times a day (BID) | CUTANEOUS | 4 refills | Status: DC

## 2017-01-21 NOTE — Progress Notes (Addendum)
   Subjective:    Patient ID: Michael Velazquez, male    DOB: Aug 21, 1945, 71 y.o.   MRN: [id number]  Diabetes  He presents for his follow-up diabetic visit. He has type 2 diabetes mellitus. Pertinent negatives for hypoglycemia include no confusion. Pertinent negatives for diabetes include no chest pain, no fatigue, no polydipsia, no polyphagia and no weakness. He is compliant with treatment all of the time. Exercise: needs to do more exercise. Eye exam is not current.  A1C 6.4. Patient takes his blood pressure medicine regular basis denies any dizziness Callus on bottom of left foot.  Diabetic foot exam completed today Concerns about a spot on left hand. Came up about one month ago.  Patient does occasionally get a low sugar when he does not snack or eat during the day Stiffness in left middle finger. Started about 5 months ago.  Intermittently stiffness in the middle finger denies joint pain and other hands  Review of Systems  Constitutional: Negative for activity change, appetite change and fatigue.  HENT: Negative for congestion.   Respiratory: Negative for cough.   Cardiovascular: Negative for chest pain.  Gastrointestinal: Negative for abdominal pain.  Endocrine: Negative for polydipsia and polyphagia.  Neurological: Negative for weakness.  Psychiatric/Behavioral: Negative for confusion.       Objective:   Physical Exam  Constitutional: He appears well-nourished. No distress.  Cardiovascular: Normal rate, regular rhythm and normal heart sounds.   No murmur heard. Pulmonary/Chest: Effort normal and breath sounds normal. No respiratory distress.  Musculoskeletal: He exhibits no edema.  Lymphadenopathy:    He has no cervical adenopathy.  Neurological: He is alert.  Psychiatric: His behavior is normal.  Vitals reviewed.  Red lesion on the left hand were pearly edge has appearance of possible basal cell cancer could also be early tinea  Senile purpura noted         Assessment & Plan:  Wellness exam along with diabetes check up in 6 months  Diabetes good control continue current measures-occasional low sugar spells next recommended if ongoing low sugar spells stop glipizide  Arthralgia middle finger PIP joint will check lab work more than likely osteoarthritis from all his hand work  Hyperlipidemia patient did not do lab work previous labs reviewed This order  Patient overdue PSA screening did not do lab work  Plantar wart referral to [place]   Possible early basal cell cancer versus tinea creams sent in referral to dermatology  25 minutes was spent with the patient. Greater than half the time was spent in discussion and answering questions and counseling regarding the issues that the patient came in for today.  Mild diabetic neuropathy no medication necessary proper foot instruction given  Senile purpura noted  Colonoscopy recommended patient states he may get this fall

## 2017-01-21 NOTE — Addendum Note (Signed)
Addended by: Sallee Lange A on: 01/21/2017 12:35 PM   Modules accepted: Orders

## 2017-01-21 NOTE — Patient Instructions (Signed)

## 2017-01-22 LAB — HEPATIC FUNCTION PANEL
ALBUMIN: 4.5 g/dL (ref 3.6–4.8)
ALK PHOS: 42 IU/L (ref 39–117)
ALT: 22 IU/L (ref 0–44)
AST: 19 IU/L (ref 0–40)
BILIRUBIN, DIRECT: 0.22 mg/dL (ref 0.00–0.40)
Bilirubin Total: 0.8 mg/dL (ref 0.0–1.2)
TOTAL PROTEIN: 7.2 g/dL (ref 6.0–8.5)

## 2017-01-22 LAB — SEDIMENTATION RATE: Sed Rate: 3 mm/hr (ref 0–30)

## 2017-01-22 LAB — MICROALBUMIN / CREATININE URINE RATIO
Creatinine, Urine: 41.1 mg/dL
Microalb/Creat Ratio: 1350.4 mg/g creat — ABNORMAL HIGH (ref 0.0–30.0)
Microalbumin, Urine: 555 ug/mL

## 2017-01-22 LAB — BASIC METABOLIC PANEL
BUN/Creatinine Ratio: 20 (ref 10–24)
BUN: 24 mg/dL (ref 8–27)
CALCIUM: 9.9 mg/dL (ref 8.6–10.2)
CHLORIDE: 98 mmol/L (ref 96–106)
CO2: 25 mmol/L (ref 20–29)
CREATININE: 1.19 mg/dL (ref 0.76–1.27)
GFR calc Af Amer: 72 mL/min/{1.73_m2} (ref >59)
GFR calc non Af Amer: 62 mL/min/{1.73_m2} (ref >59)
GLUCOSE: 194 mg/dL — AB (ref 65–99)
Potassium: 4.2 mmol/L (ref 3.5–5.2)
Sodium: 137 mmol/L (ref 134–144)

## 2017-01-22 LAB — PSA: PROSTATE SPECIFIC AG, SERUM: 0.4 ng/mL (ref 0.0–4.0)

## 2017-01-22 LAB — LIPID PANEL
Chol/HDL Ratio: 2.6 ratio (ref 0.0–5.0)
Cholesterol, Total: 105 mg/dL (ref 100–199)
HDL: 40 mg/dL (ref >39)
LDL Calculated: 48 mg/dL (ref 0–99)
Triglycerides: 85 mg/dL (ref 0–149)
VLDL Cholesterol Cal: 17 mg/dL (ref 5–40)

## 2017-01-22 LAB — RHEUMATOID FACTOR: Rhuematoid fact SerPl-aCnc: <10 IU/mL (ref 0.0–13.9)

## 2017-01-28 ENCOUNTER — Encounter: Payer: Self-pay | Admitting: Family Medicine

## 2017-03-01 ENCOUNTER — Other Ambulatory Visit: Payer: Self-pay | Admitting: Family Medicine

## 2017-03-02 ENCOUNTER — Encounter: Payer: Self-pay | Admitting: Dermatology

## 2017-03-02 DIAGNOSIS — D492 Neoplasm of unspecified behavior of bone, soft tissue, and skin: Secondary | ICD-10-CM | POA: Diagnosis not present

## 2017-03-02 DIAGNOSIS — D0439 Carcinoma in situ of skin of other parts of face: Secondary | ICD-10-CM | POA: Diagnosis not present

## 2017-03-02 DIAGNOSIS — D1800 Hemangioma unspecified site: Secondary | ICD-10-CM | POA: Diagnosis not present

## 2017-03-02 DIAGNOSIS — L570 Actinic keratosis: Secondary | ICD-10-CM | POA: Diagnosis not present

## 2017-03-02 DIAGNOSIS — B070 Plantar wart: Secondary | ICD-10-CM | POA: Diagnosis not present

## 2017-05-07 ENCOUNTER — Other Ambulatory Visit: Payer: Self-pay | Admitting: Family Medicine

## 2017-05-13 DIAGNOSIS — D0439 Carcinoma in situ of skin of other parts of face: Secondary | ICD-10-CM | POA: Diagnosis not present

## 2017-05-13 DIAGNOSIS — D492 Neoplasm of unspecified behavior of bone, soft tissue, and skin: Secondary | ICD-10-CM | POA: Diagnosis not present

## 2017-05-13 DIAGNOSIS — L820 Inflamed seborrheic keratosis: Secondary | ICD-10-CM | POA: Diagnosis not present

## 2017-06-10 ENCOUNTER — Encounter: Payer: Medicare Other | Admitting: Family Medicine

## 2017-06-27 ENCOUNTER — Other Ambulatory Visit: Payer: Self-pay | Admitting: Family Medicine

## 2017-07-01 ENCOUNTER — Encounter: Payer: Self-pay | Admitting: Family Medicine

## 2017-07-01 ENCOUNTER — Ambulatory Visit: Payer: Medicare Other | Admitting: Family Medicine

## 2017-07-01 VITALS — BP 140/88 | Ht 66.0 in | Wt 156.0 lb

## 2017-07-01 DIAGNOSIS — E119 Type 2 diabetes mellitus without complications: Secondary | ICD-10-CM | POA: Diagnosis not present

## 2017-07-01 DIAGNOSIS — Z23 Encounter for immunization: Secondary | ICD-10-CM | POA: Diagnosis not present

## 2017-07-01 DIAGNOSIS — I10 Essential (primary) hypertension: Secondary | ICD-10-CM

## 2017-07-01 DIAGNOSIS — Z0000 Encounter for general adult medical examination without abnormal findings: Secondary | ICD-10-CM

## 2017-07-01 DIAGNOSIS — E7849 Other hyperlipidemia: Secondary | ICD-10-CM

## 2017-07-01 DIAGNOSIS — Z1211 Encounter for screening for malignant neoplasm of colon: Secondary | ICD-10-CM | POA: Diagnosis not present

## 2017-07-01 LAB — POCT GLYCOSYLATED HEMOGLOBIN (HGB A1C): Hemoglobin A1C: 6.7

## 2017-07-01 MED ORDER — ZOSTER VAC RECOMB ADJUVANTED 50 MCG/0.5ML IM SUSR: 0.5000 mL | Freq: Once | INTRAMUSCULAR | 0 refills | Status: AC

## 2017-07-01 NOTE — Progress Notes (Signed)
Subjective:    Patient ID: LUISANTONIO ADINOLFI, male    DOB: November 11, 1945, 71 y.o.   MRN: [id number]  HPI AWV- Annual Wellness Visit  The patient was seen for their annual wellness visit. The patient's past medical history, surgical history, and family history were reviewed. Pertinent vaccines were reviewed ( tetanus, pneumonia, shingles, flu) The patient's medication list was reviewed and updated.  The height and weight were entered. The patient's current BMI is: 25.18  Cognitive screening was completed. Outcome of Mini - Cog: pass  Falls within the past 6 months: none  Current tobacco usage: none (All patients who use tobacco were given written and verbal information on quitting)  Recent listing of emergency department/hospitalizations over the past year were reviewed.  current specialist the patient sees on a regular basis: none Recent lab work reviewed patient overall doing well with his medications. Blood pressure is borderline he was encouraged to watch his diet try to check his blood pressures on a regular basis at home and send [place] some readings  Medicare annual wellness visit patient questionnaire was reviewed.  A written screening schedule for the patient for the next 5-10 years was given. Appropriate discussion of followup regarding next visit was discussed.  Results for orders placed or performed in visit on 07/01/17  POCT glycosylated hemoglobin (Hb A1C)  Result Value Ref Range   Hemoglobin A1C 6.7        Review of Systems  Constitutional: Negative for activity change, appetite change and fatigue.  HENT: Negative for congestion.   Respiratory: Negative for cough.   Cardiovascular: Negative for chest pain.  Gastrointestinal: Negative for abdominal pain.  Endocrine: Negative for polydipsia and polyphagia.  Genitourinary: Negative for dysuria.  Neurological: Negative for weakness and headaches.  Psychiatric/Behavioral: Negative for confusion.       Objective:     Physical Exam  Constitutional: He appears well-developed and well-nourished.  HENT:  Head: Normocephalic and atraumatic.  Right Ear: External ear normal.  Left Ear: External ear normal.  Nose: Nose normal.  Mouth/Throat: Oropharynx is clear and moist.  Eyes: EOM are normal. Pupils are equal, round, and reactive to light.  Neck: Normal range of motion. Neck supple. No thyromegaly present.  Cardiovascular: Normal rate, regular rhythm and normal heart sounds.  No murmur heard. Pulmonary/Chest: Effort normal and breath sounds normal. No respiratory distress. He has no wheezes.  Abdominal: Soft. Bowel sounds are normal. He exhibits no distension and no mass. There is no tenderness.  Genitourinary: Prostate normal and penis normal.  Musculoskeletal: Normal range of motion. He exhibits no edema.  Lymphadenopathy:    He has no cervical adenopathy.  Neurological: He is alert. He exhibits normal muscle tone.  Skin: Skin is warm and dry. No erythema.  Psychiatric: He has a normal mood and affect. His behavior is normal. Judgment normal.          Assessment & Plan:  Adult wellness-complete.wellness physical was conducted today. Importance of diet and exercise were discussed in detail. In addition to this a discussion regarding safety was also covered. We also reviewed over immunizations and gave recommendations regarding current immunization needed for age. In addition to this additional areas were also touched on including: Preventative health exams needed: Colonoscopy referred to Dr Laural Golden  Patient was advised yearly wellness exam  The patient was seen today as part of a comprehensive visit for diabetes. The importance of keeping her A1c at or below 7 was discussed. Importance of regular physical activity was  discussed. Proper monitoring of glucose levels with glucometer discussed. The importance of adherence to medication as well as a controlled low starch/sugar diet was also discussed.  Also discussion regarding the importance of diabetic foot checks including self check every day. Also yearly diabetic eye exams recommended. The importance of keeping blood pressure under control and keeping LDL below 70 was also discussed. Also the importance of avoiding smoking. Standard follow-up visit recommended. Finally failure to follow good diabetic measures including self effort and compliance with recommendations can certainly increase the risk of heart disease strokes kidney failure blindness loss of limb and early death was discussed with the patient.   HTN- Patient was seen today as part of a visit regarding hypertension. The importance of healthy diet and regular physical activity was discussed. The importance of compliance with medications discussed. Ideal goal is to keep blood pressure low elevated levels certainly below 154/00 when possible. The patient was counseled that keeping blood pressure under control lessen his risk of heart attack, stroke, kidney failure, and early death. The importance of regular follow-ups was discussed with the patient. Low-salt diet such as DASH recommended. Regular physical activity was recommended as well. Patient was advised to keep regular follow-ups.  The patient was seen today as part of an evaluation regarding hyperlipidemia. Recent lab work has been reviewed with the patient as well as the goals for good cholesterol care. In addition to this medications have been discussed the importance of compliance with diet and medications discussed as well. Patient has been informed of potential side effects of medications in the importance to notify us should any problems occur. Finally the patient is aware that poor control of cholesterol, noncompliance can dramatically increase her risk of heart attack strokes and premature death. The patient will keep regular office visits and the patient does agreed to periodic lab work.

## 2017-07-08 ENCOUNTER — Encounter: Payer: Self-pay | Admitting: Family Medicine

## 2017-07-08 ENCOUNTER — Encounter (INDEPENDENT_AMBULATORY_CARE_PROVIDER_SITE_OTHER): Payer: Self-pay | Admitting: *Deleted

## 2017-08-25 ENCOUNTER — Other Ambulatory Visit: Payer: Self-pay | Admitting: Family Medicine

## 2017-09-11 ENCOUNTER — Other Ambulatory Visit: Payer: Self-pay | Admitting: Family Medicine

## 2017-10-24 ENCOUNTER — Telehealth: Payer: Self-pay | Admitting: Family Medicine

## 2017-10-24 NOTE — Telephone Encounter (Signed)
The patient multiple lab tests including lipid, liver, metabolic 7, hemoglobin X2V, PSA, urine ACR.  He should do these may 2019 with follow-up office visit in June as scheduled

## 2017-10-25 ENCOUNTER — Other Ambulatory Visit: Payer: Self-pay | Admitting: Family Medicine

## 2017-10-25 DIAGNOSIS — E119 Type 2 diabetes mellitus without complications: Secondary | ICD-10-CM

## 2017-10-25 DIAGNOSIS — E782 Mixed hyperlipidemia: Secondary | ICD-10-CM

## 2017-10-25 DIAGNOSIS — Z0000 Encounter for general adult medical examination without abnormal findings: Secondary | ICD-10-CM

## 2017-10-25 NOTE — Telephone Encounter (Signed)
Lab orders placed in Epic and spouse notified

## 2017-11-06 ENCOUNTER — Other Ambulatory Visit: Payer: Self-pay | Admitting: Family Medicine

## 2017-11-18 ENCOUNTER — Other Ambulatory Visit: Payer: Self-pay | Admitting: Family Medicine

## 2017-11-19 ENCOUNTER — Other Ambulatory Visit: Payer: Self-pay | Admitting: Family Medicine

## 2017-12-17 ENCOUNTER — Other Ambulatory Visit: Payer: Self-pay

## 2017-12-17 NOTE — Patient Outreach (Signed)
[facility]) Care Management  12/17/2017  Michael Velazquez 12/31/1945 [id number]   Medication Adherence call to Mr. Irwin Toran left a message for patient to call back. [facility] said patient has not pick up since April they have a Medication Adherence program too and has not been successful patient will fail the metric Mr. Opiela is past due on Lisinopril/Hctz 20/25 and Atorvastatin 10 mg.patient is showing past due under [facility] Ins.for Medication Adherence call.   [place] Management Direct Dial [phone]  Fax [phone] [email or web address]

## 2017-12-31 ENCOUNTER — Ambulatory Visit: Payer: Medicare Other | Admitting: Family Medicine

## 2018-01-05 ENCOUNTER — Other Ambulatory Visit: Payer: Self-pay | Admitting: Family Medicine

## 2018-01-10 ENCOUNTER — Other Ambulatory Visit: Payer: Self-pay | Admitting: Family Medicine

## 2018-01-18 ENCOUNTER — Ambulatory Visit: Payer: Medicare Other | Admitting: Family Medicine

## 2018-01-20 ENCOUNTER — Other Ambulatory Visit: Payer: Self-pay | Admitting: Family Medicine

## 2018-02-09 ENCOUNTER — Ambulatory Visit: Payer: Medicare Other | Admitting: Family Medicine

## 2018-02-11 ENCOUNTER — Other Ambulatory Visit: Payer: Self-pay | Admitting: Family Medicine

## 2018-02-17 ENCOUNTER — Other Ambulatory Visit: Payer: Self-pay | Admitting: Family Medicine

## 2018-02-28 ENCOUNTER — Ambulatory Visit: Payer: Medicare Other | Admitting: Family Medicine

## 2018-03-09 ENCOUNTER — Other Ambulatory Visit: Payer: Self-pay | Admitting: Family Medicine

## 2018-03-14 ENCOUNTER — Other Ambulatory Visit: Payer: Self-pay

## 2018-03-14 NOTE — Patient Outreach (Signed)
[facility]) Care Management  03/14/2018  Michael Velazquez 26-Oct-1945 [id number]   Medication Adherence call to Michael Velazquez left a message for patient to call back patient is due on Atorvastatin 10 mg. Michael Velazquez is showing past due under [place].   [place] Management Direct Dial [phone]  Fax [phone] [name]

## 2018-03-21 ENCOUNTER — Ambulatory Visit: Payer: Medicare Other | Admitting: Family Medicine

## 2018-03-21 VITALS — BP 128/82 | Ht 66.0 in | Wt 158.2 lb

## 2018-03-21 DIAGNOSIS — I10 Essential (primary) hypertension: Secondary | ICD-10-CM | POA: Diagnosis not present

## 2018-03-21 DIAGNOSIS — E782 Mixed hyperlipidemia: Secondary | ICD-10-CM | POA: Diagnosis not present

## 2018-03-21 DIAGNOSIS — E7849 Other hyperlipidemia: Secondary | ICD-10-CM

## 2018-03-21 DIAGNOSIS — E1121 Type 2 diabetes mellitus with diabetic nephropathy: Secondary | ICD-10-CM

## 2018-03-21 DIAGNOSIS — Z0000 Encounter for general adult medical examination without abnormal findings: Secondary | ICD-10-CM | POA: Diagnosis not present

## 2018-03-21 DIAGNOSIS — E119 Type 2 diabetes mellitus without complications: Secondary | ICD-10-CM | POA: Diagnosis not present

## 2018-03-21 MED ORDER — ATENOLOL 50 MG PO TABS: 50.0000 mg | ORAL_TABLET | Freq: Two times a day (BID) | ORAL | 1 refills | Status: DC

## 2018-03-21 MED ORDER — GLIPIZIDE ER 2.5 MG PO TB24: ORAL_TABLET | ORAL | 1 refills | Status: DC

## 2018-03-21 MED ORDER — METFORMIN HCL 1000 MG PO TABS: ORAL_TABLET | ORAL | 1 refills | Status: DC

## 2018-03-21 MED ORDER — SITAGLIPTIN PHOSPHATE 100 MG PO TABS: 100.0000 mg | ORAL_TABLET | Freq: Every day | ORAL | 1 refills | Status: DC

## 2018-03-21 MED ORDER — AMLODIPINE BESYLATE 5 MG PO TABS: 5.0000 mg | ORAL_TABLET | Freq: Every day | ORAL | 1 refills | Status: DC

## 2018-03-21 MED ORDER — LISINOPRIL-HYDROCHLOROTHIAZIDE 20-25 MG PO TABS: 1.0000 | ORAL_TABLET | Freq: Every day | ORAL | 1 refills | Status: DC

## 2018-03-21 MED ORDER — ATORVASTATIN CALCIUM 10 MG PO TABS: ORAL_TABLET | ORAL | 1 refills | Status: DC

## 2018-03-21 NOTE — Progress Notes (Signed)
Subjective:    Patient ID: Michael Velazquez, male    DOB: 07-22-1946, 72 y.o.   MRN: [id number]  Diabetes  He presents for his follow-up diabetic visit. He has type 2 diabetes mellitus. Pertinent negatives for hypoglycemia include no confusion, dizziness or headaches. Pertinent negatives for diabetes include no chest pain and no fatigue. Risk factors for coronary artery disease include dyslipidemia and hypertension. Current diabetic treatment includes oral agent (triple therapy). He is compliant with treatment all of the time. His weight is stable. He is following a diabetic diet. He has not had a previous visit with a dietitian. He does not see a podiatrist.Eye exam is not current.   Patient here for follow-up regarding cholesterol.  The patient does have hyperlipidemia.  Patient does try to maintain a reasonable diet.  Patient does take the medication on a regular basis.  Denies missing a dose.  The patient denies any obvious side effects.  Prior blood work results reviewed with the patient.  The patient is aware of his cholesterol goals and the need to keep it under good control to lessen the risk of disease.  Patient for blood pressure check up.  The patient does have hypertension.  The patient is on medication.  Patient relates compliance with meds. Todays BP reviewed with the patient. Patient denies issues with medication. Patient relates reasonable diet. Patient tries to minimize salt. Patient aware of BP goals.     Review of Systems  Constitutional: Negative for diaphoresis and fatigue.  HENT: Negative for congestion and rhinorrhea.   Respiratory: Negative for cough and shortness of breath.   Cardiovascular: Negative for chest pain and leg swelling.  Gastrointestinal: Negative for abdominal pain and diarrhea.  Skin: Negative for color change and rash.  Neurological: Negative for dizziness and headaches.  Psychiatric/Behavioral: Negative for behavioral problems and confusion.      Objective:   Physical Exam  Constitutional: He appears well-nourished. No distress.  HENT:  Head: Normocephalic and atraumatic.  Eyes: Right eye exhibits no discharge. Left eye exhibits no discharge.  Neck: No tracheal deviation present.  Cardiovascular: Normal rate, regular rhythm and normal heart sounds.  No murmur heard. Pulmonary/Chest: Effort normal and breath sounds normal. No respiratory distress.  Musculoskeletal: He exhibits no edema.  Lymphadenopathy:    He has no cervical adenopathy.  Neurological: He is alert. Coordination normal.  Skin: Skin is warm and dry.  Psychiatric: He has a normal mood and affect. His behavior is normal.  Vitals reviewed.         Assessment & Plan:  HTN- Patient was seen today as part of a visit regarding hypertension. The importance of healthy diet and regular physical activity was discussed. The importance of compliance with medications discussed.  Ideal goal is to keep blood pressure low elevated levels certainly below 756/43 when possible.  The patient was counseled that keeping blood pressure under control lessen his risk of complications.  The importance of regular follow-ups was discussed with the patient.  Low-salt diet such as DASH recommended.  Regular physical activity was recommended as well.  Patient was advised to keep regular follow-ups.  The patient was seen today as part of an evaluation regarding hyperlipidemia.  Recent lab work has been reviewed with the patient as well as the goals for good cholesterol care.  In addition to this medications have been discussed the importance of compliance with diet and medications discussed as well.  Finally the patient is aware that poor control of cholesterol,  noncompliance can dramatically increase the risk of complications. The patient will keep regular office visits and the patient does agreed to periodic lab work.  The patient was seen today as part of a comprehensive visit for  diabetes. The importance of keeping her A1c at or below 7 was discussed.  Importance of regular physical activity was discussed.   The importance of adherence to medication as well as a controlled low starch/sugar diet was also discussed.  Standard follow-up visit recommended.  Also patient aware failure to keep diabetes under control increases the risk of complications.  Patient will do his lab work we will follow-up the results make adjustments accordingly  Wellness and office visit 6 months

## 2018-03-22 LAB — BASIC METABOLIC PANEL
BUN / CREAT RATIO: 21 (ref 10–24)
BUN: 26 mg/dL (ref 8–27)
CHLORIDE: 98 mmol/L (ref 96–106)
CO2: 23 mmol/L (ref 20–29)
Calcium: 9.9 mg/dL (ref 8.6–10.2)
Creatinine, Ser: 1.25 mg/dL (ref 0.76–1.27)
GFR calc Af Amer: 67 mL/min/{1.73_m2} (ref >59)
GFR calc non Af Amer: 58 mL/min/{1.73_m2} — ABNORMAL LOW (ref >59)
GLUCOSE: 262 mg/dL — AB (ref 65–99)
Potassium: 5 mmol/L (ref 3.5–5.2)
Sodium: 138 mmol/L (ref 134–144)

## 2018-03-22 LAB — HEMOGLOBIN A1C
ESTIMATED AVERAGE GLUCOSE: 174 mg/dL
HEMOGLOBIN A1C: 7.7 % — AB (ref 4.8–5.6)

## 2018-03-22 LAB — LIPID PANEL
CHOLESTEROL TOTAL: 121 mg/dL (ref 100–199)
Chol/HDL Ratio: 3.1 ratio (ref 0.0–5.0)
HDL: 39 mg/dL — ABNORMAL LOW (ref >39)
LDL CALC: 55 mg/dL (ref 0–99)
Triglycerides: 134 mg/dL (ref 0–149)
VLDL CHOLESTEROL CAL: 27 mg/dL (ref 5–40)

## 2018-03-22 LAB — HEPATIC FUNCTION PANEL
ALT: 19 IU/L (ref 0–44)
AST: 17 IU/L (ref 0–40)
Albumin: 4.6 g/dL (ref 3.5–4.8)
Alkaline Phosphatase: 43 IU/L (ref 39–117)
BILIRUBIN, DIRECT: 0.2 mg/dL (ref 0.00–0.40)
Bilirubin Total: 0.7 mg/dL (ref 0.0–1.2)
TOTAL PROTEIN: 7.3 g/dL (ref 6.0–8.5)

## 2018-03-22 LAB — MICROALBUMIN / CREATININE URINE RATIO
Creatinine, Urine: 87.9 mg/dL
MICROALB/CREAT RATIO: 841.3 mg/g{creat} — AB (ref 0.0–30.0)
Microalbumin, Urine: 739.5 ug/mL

## 2018-03-22 LAB — PSA: PROSTATE SPECIFIC AG, SERUM: 0.5 ng/mL (ref 0.0–4.0)

## 2018-03-30 ENCOUNTER — Telehealth: Payer: Self-pay | Admitting: Family Medicine

## 2018-03-30 MED ORDER — LISINOPRIL-HYDROCHLOROTHIAZIDE 20-25 MG PO TABS: 1.0000 | ORAL_TABLET | Freq: Every day | ORAL | 0 refills | Status: DC

## 2018-03-30 MED ORDER — AMLODIPINE BESYLATE 5 MG PO TABS: 5.0000 mg | ORAL_TABLET | Freq: Every day | ORAL | 0 refills | Status: DC

## 2018-03-30 MED ORDER — ATENOLOL 50 MG PO TABS: 50.0000 mg | ORAL_TABLET | Freq: Two times a day (BID) | ORAL | 0 refills | Status: DC
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
## 2018-03-30 NOTE — Telephone Encounter (Signed)
Prescription sent electronically to pharmacy. Patient notified. 

## 2018-04-14 ENCOUNTER — Other Ambulatory Visit: Payer: Self-pay | Admitting: Family Medicine

## 2018-06-20 ENCOUNTER — Other Ambulatory Visit: Payer: Self-pay

## 2018-06-20 NOTE — Patient Outreach (Signed)
[facility]) Care Management  06/20/2018  Michael Velazquez [birth year] [id number]   Medication Adherence call to Michael Velazquez left a message for patient to call back patient is due on Lisinopril / HCTZ 20/25 mg. Michael Velazquez is showing past due under [place].   [place] Management Direct Dial [phone]  Fax [phone] [email or web address]

## 2018-06-29 DIAGNOSIS — Z23 Encounter for immunization: Secondary | ICD-10-CM | POA: Diagnosis not present

## 2018-07-14 ENCOUNTER — Encounter: Payer: Medicare Other | Admitting: Family Medicine

## 2018-09-06 ENCOUNTER — Other Ambulatory Visit: Payer: Self-pay | Admitting: Family Medicine

## 2018-09-06 ENCOUNTER — Ambulatory Visit: Payer: Medicare Other | Admitting: Family Medicine

## 2018-10-05 ENCOUNTER — Other Ambulatory Visit: Payer: Self-pay

## 2018-10-05 ENCOUNTER — Encounter: Payer: Self-pay | Admitting: Family Medicine

## 2018-10-05 ENCOUNTER — Ambulatory Visit (INDEPENDENT_AMBULATORY_CARE_PROVIDER_SITE_OTHER): Payer: Medicare Other | Admitting: Family Medicine

## 2018-10-05 VITALS — BP 148/82 | Ht 66.0 in | Wt 159.2 lb

## 2018-10-05 DIAGNOSIS — Z1159 Encounter for screening for other viral diseases: Secondary | ICD-10-CM

## 2018-10-05 DIAGNOSIS — E119 Type 2 diabetes mellitus without complications: Secondary | ICD-10-CM

## 2018-10-05 DIAGNOSIS — E7849 Other hyperlipidemia: Secondary | ICD-10-CM | POA: Diagnosis not present

## 2018-10-05 DIAGNOSIS — I10 Essential (primary) hypertension: Secondary | ICD-10-CM | POA: Diagnosis not present

## 2018-10-05 DIAGNOSIS — E1121 Type 2 diabetes mellitus with diabetic nephropathy: Secondary | ICD-10-CM

## 2018-10-05 DIAGNOSIS — Z1211 Encounter for screening for malignant neoplasm of colon: Secondary | ICD-10-CM

## 2018-10-05 LAB — POCT GLYCOSYLATED HEMOGLOBIN (HGB A1C): Hemoglobin A1C: 7.4 % — AB (ref 4.0–5.6)

## 2018-10-05 MED ORDER — METFORMIN HCL 1000 MG PO TABS: ORAL_TABLET | ORAL | 1 refills | Status: DC

## 2018-10-05 MED ORDER — GLIPIZIDE ER 2.5 MG PO TB24: ORAL_TABLET | ORAL | 1 refills | Status: DC

## 2018-10-05 MED ORDER — LISINOPRIL-HYDROCHLOROTHIAZIDE 20-25 MG PO TABS: 1.0000 | ORAL_TABLET | Freq: Every day | ORAL | 1 refills | Status: DC

## 2018-10-05 MED ORDER — ATORVASTATIN CALCIUM 10 MG PO TABS: ORAL_TABLET | ORAL | 1 refills | Status: DC

## 2018-10-05 MED ORDER — AMLODIPINE BESYLATE 10 MG PO TABS: 10.0000 mg | ORAL_TABLET | Freq: Every day | ORAL | 1 refills | Status: DC

## 2018-10-05 MED ORDER — SITAGLIPTIN PHOSPHATE 100 MG PO TABS: 100.0000 mg | ORAL_TABLET | Freq: Every day | ORAL | 1 refills | Status: DC

## 2018-10-05 MED ORDER — ATENOLOL 50 MG PO TABS: 50.0000 mg | ORAL_TABLET | Freq: Two times a day (BID) | ORAL | 1 refills | Status: DC

## 2018-10-05 NOTE — Progress Notes (Signed)
   Subjective:    Patient ID: Michael Velazquez, male    DOB: [birth year], 73 y.o.   MRN: [id number]  Diabetes  He presents for his initial diabetic visit. He has type 2 diabetes mellitus. Pertinent negatives for hypoglycemia include no confusion, dizziness or headaches. Pertinent negatives for diabetes include no chest pain and no fatigue. Risk factors for coronary artery disease include dyslipidemia, diabetes mellitus and hypertension. Current diabetic treatment includes oral agent (dual therapy). He is compliant with treatment all of the time. His weight is stable. He is following a diabetic diet.   Results for orders placed or performed in visit on 10/05/18  POCT glycosylated hemoglobin (Hb A1C)  Result Value Ref Range   Hemoglobin A1C 7.4 (A) 4.0 - 5.6 %   HbA1c POC (<> result, manual entry)     HbA1c, POC (prediabetic range)     HbA1c, POC (controlled diabetic range)        Review of Systems  Constitutional: Negative for diaphoresis and fatigue.  HENT: Negative for congestion and rhinorrhea.   Respiratory: Negative for cough and shortness of breath.   Cardiovascular: Negative for chest pain and leg swelling.  Gastrointestinal: Negative for abdominal pain and diarrhea.  Skin: Negative for color change and rash.  Neurological: Negative for dizziness and headaches.  Psychiatric/Behavioral: Negative for behavioral problems and confusion.       Objective:   Physical Exam Vitals signs reviewed.  Constitutional:      General: He is not in acute distress. HENT:     Head: Normocephalic and atraumatic.  Eyes:     General:        Right eye: No discharge.        Left eye: No discharge.  Neck:     Trachea: No tracheal deviation.  Cardiovascular:     Rate and Rhythm: Normal rate and regular rhythm.     Heart sounds: Normal heart sounds. No murmur.  Pulmonary:     Effort: Pulmonary effort is normal. No respiratory distress.     Breath sounds: Normal breath sounds.   Lymphadenopathy:     Cervical: No cervical adenopathy.  Skin:    General: Skin is warm and dry.  Neurological:     Mental Status: He is alert.     Coordination: Coordination normal.  Psychiatric:        Behavior: Behavior normal.           Assessment & Plan:  Hypertension subpar control very important increase amlodipine to 10 mg if he has significant pedal edema he will let us know otherwise follow-up within 3 months  Diabetes fair control needs to do better job watching diet continue with medication be more consistent with medications.  Hyperlipidemia previous labs reviewed new labs ordered await the results patient encouraged to try to take his medicine on a regular basis.  Colonoscopy recommended for the patient he has been resistant to do so Patient encouraged to do eye exam 25 minutes was spent with the patient.  This statement verifies that 25 minutes was indeed spent with the patient.  More than 50% of this visit-total duration of the visit-was spent in counseling and coordination of care. The issues that the patient came in for today as reflected in the diagnosis (s) please refer to documentation for further details.

## 2018-10-06 LAB — BASIC METABOLIC PANEL
BUN / CREAT RATIO: 19 (ref 10–24)
BUN: 25 mg/dL (ref 8–27)
CO2: 24 mmol/L (ref 20–29)
Calcium: 9.8 mg/dL (ref 8.6–10.2)
Chloride: 98 mmol/L (ref 96–106)
Creatinine, Ser: 1.32 mg/dL — ABNORMAL HIGH (ref 0.76–1.27)
GFR calc Af Amer: 62 mL/min/{1.73_m2} (ref >59)
GFR, EST NON AFRICAN AMERICAN: 54 mL/min/{1.73_m2} — AB (ref >59)
Glucose: 213 mg/dL — ABNORMAL HIGH (ref 65–99)
Potassium: 4.9 mmol/L (ref 3.5–5.2)
Sodium: 139 mmol/L (ref 134–144)

## 2018-10-06 LAB — LIPID PANEL
Chol/HDL Ratio: 3 ratio (ref 0.0–5.0)
Cholesterol, Total: 110 mg/dL (ref 100–199)
HDL: 37 mg/dL — AB (ref >39)
LDL Calculated: 50 mg/dL (ref 0–99)
Triglycerides: 114 mg/dL (ref 0–149)
VLDL Cholesterol Cal: 23 mg/dL (ref 5–40)

## 2018-10-06 LAB — HEPATITIS C ANTIBODY: Hep C Virus Ab: <0.1 s/co ratio (ref 0.0–0.9)

## 2018-10-07 ENCOUNTER — Encounter: Payer: Self-pay | Admitting: Family Medicine

## 2018-10-10 ENCOUNTER — Encounter (INDEPENDENT_AMBULATORY_CARE_PROVIDER_SITE_OTHER): Payer: Self-pay | Admitting: *Deleted

## 2018-10-10 ENCOUNTER — Encounter: Payer: Self-pay | Admitting: Family Medicine

## 2018-10-30 ENCOUNTER — Other Ambulatory Visit: Payer: Self-pay | Admitting: Family Medicine

## 2019-03-05 ENCOUNTER — Other Ambulatory Visit: Payer: Self-pay | Admitting: Family Medicine

## 2019-03-06 NOTE — Telephone Encounter (Signed)
Patient needs follow-up regarding his diabetes may have 1 refill  needs OV by sept in office or virtual

## 2019-03-31 ENCOUNTER — Other Ambulatory Visit: Payer: Self-pay | Admitting: Family Medicine

## 2019-04-12 ENCOUNTER — Ambulatory Visit: Payer: Medicare Other | Admitting: Family Medicine

## 2019-04-19 ENCOUNTER — Other Ambulatory Visit: Payer: Self-pay

## 2019-04-19 NOTE — Patient Outreach (Signed)
[name]) Care Management  04/19/2019  Michael Velazquez Feb 01, 1946 [id number]   Medication Adherence call to Michael Velazquez HIPPA Compliant Voice message left with a call back number. Michael Velazquez is showing past due on Januvia 100 mg under [place].   [place] Management Direct Dial [phone]  Fax [phone] [email or web address]

## 2019-05-12 DIAGNOSIS — Z23 Encounter for immunization: Secondary | ICD-10-CM | POA: Diagnosis not present

## 2019-05-20 ENCOUNTER — Other Ambulatory Visit: Payer: Self-pay | Admitting: Family Medicine

## 2019-05-22 NOTE — Telephone Encounter (Signed)
May have 1 month worth of medicine needs to do virtual or in person visit this fall

## 2019-05-23 NOTE — Telephone Encounter (Signed)
Please schedule and then route back to nurses to send in meds

## 2019-05-24 NOTE — Telephone Encounter (Signed)
OV scheduled for 06/07/2019, please send in refills

## 2019-06-07 ENCOUNTER — Ambulatory Visit: Payer: Medicare Other | Admitting: Family Medicine

## 2019-06-09 ENCOUNTER — Other Ambulatory Visit: Payer: Self-pay | Admitting: Family Medicine

## 2019-06-09 NOTE — Telephone Encounter (Signed)
90-day only has follow-up visit in December

## 2019-06-13 ENCOUNTER — Other Ambulatory Visit: Payer: Self-pay | Admitting: Family Medicine

## 2019-07-06 ENCOUNTER — Ambulatory Visit (INDEPENDENT_AMBULATORY_CARE_PROVIDER_SITE_OTHER): Payer: Medicare Other | Admitting: Family Medicine

## 2019-07-06 ENCOUNTER — Other Ambulatory Visit: Payer: Self-pay

## 2019-07-06 ENCOUNTER — Encounter: Payer: Self-pay | Admitting: Family Medicine

## 2019-07-06 VITALS — BP 136/86 | Temp 97.3°F | Ht 66.0 in | Wt 159.8 lb

## 2019-07-06 DIAGNOSIS — E7849 Other hyperlipidemia: Secondary | ICD-10-CM | POA: Diagnosis not present

## 2019-07-06 DIAGNOSIS — I10 Essential (primary) hypertension: Secondary | ICD-10-CM

## 2019-07-06 DIAGNOSIS — E119 Type 2 diabetes mellitus without complications: Secondary | ICD-10-CM | POA: Diagnosis not present

## 2019-07-06 DIAGNOSIS — E1121 Type 2 diabetes mellitus with diabetic nephropathy: Secondary | ICD-10-CM

## 2019-07-06 DIAGNOSIS — Z125 Encounter for screening for malignant neoplasm of prostate: Secondary | ICD-10-CM

## 2019-07-06 DIAGNOSIS — S46212A Strain of muscle, fascia and tendon of other parts of biceps, left arm, initial encounter: Secondary | ICD-10-CM

## 2019-07-06 LAB — POCT GLYCOSYLATED HEMOGLOBIN (HGB A1C): Hemoglobin A1C: 7 % — AB (ref 4.0–5.6)

## 2019-07-06 NOTE — Progress Notes (Signed)
Subjective:    Patient ID: Michael Velazquez, male    DOB: [birth year], 73 y.o.   MRN: [id number]  Diabetes He presents for his follow-up diabetic visit. He has type 2 diabetes mellitus. Pertinent negatives for hypoglycemia include no confusion, dizziness or headaches. Pertinent negatives for diabetes include no chest pain and no fatigue. He is compliant with treatment all of the time. Home blood sugar record trend: up and down. Eye exam current: over 2 years.   Picked up a box about one and a half weeks ago and heard a pop in his left arm. Patient relates bruising in the arm relates not as much strength as he once had Denies any other major issues currently.  Patient is taking blood pressure medicine regular basis watches salt in the diet stays active  Has cholesterol issues takes his medication regular basis tries watch diet to some degree  Patient denies any angina symptoms chest tightness pressure pain shortness of breath Covid protection discussed. Results for orders placed or performed in visit on 07/06/19  POCT HgB A1C  Result Value Ref Range   Hemoglobin A1C 7.0 (A) 4.0 - 5.6 %   HbA1c POC (<> result, manual entry)     HbA1c, POC (prediabetic range)     HbA1c, POC (controlled diabetic range)        Review of Systems  Constitutional: Negative for diaphoresis and fatigue.  HENT: Negative for congestion and rhinorrhea.   Respiratory: Negative for cough and shortness of breath.   Cardiovascular: Negative for chest pain and leg swelling.  Gastrointestinal: Negative for abdominal pain and diarrhea.  Skin: Negative for color change and rash.  Neurological: Negative for dizziness and headaches.  Psychiatric/Behavioral: Negative for behavioral problems and confusion.       Objective:   Physical Exam Vitals reviewed.  Constitutional:      General: He is not in acute distress. HENT:     Head: Normocephalic and atraumatic.  Eyes:     General:        Right eye: No  discharge.        Left eye: No discharge.  Neck:     Trachea: No tracheal deviation.  Cardiovascular:     Rate and Rhythm: Normal rate and regular rhythm.     Heart sounds: Normal heart sounds. No murmur.  Pulmonary:     Effort: Pulmonary effort is normal. No respiratory distress.     Breath sounds: Normal breath sounds.  Lymphadenopathy:     Cervical: No cervical adenopathy.  Skin:    General: Skin is warm and dry.  Neurological:     Mental Status: He is alert.     Coordination: Coordination normal.  Psychiatric:        Behavior: Behavior normal.    25 minutes was spent with the patient.  This statement verifies that 25 minutes was indeed spent with the patient.  More than 50% of this visit-total duration of the visit-was spent in counseling and coordination of care. The issues that the patient came in for today as reflected in the diagnosis (s) please refer to documentation for further details.  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 A1c acceptable Hyperlipidemia previous  labs good continue medicine Blood pressure good continue medicine Patient hold off on colonoscopy until summertime.

## 2019-07-06 NOTE — Addendum Note (Signed)
Addended by: Vicente Males on: 07/06/2019 03:06 PM   Modules accepted: Orders

## 2019-07-06 NOTE — Progress Notes (Signed)
Referral placed.

## 2019-07-06 NOTE — Patient Instructions (Addendum)
Get your eyes checked up within the next 6 months  Colonoscopy by summer  We will set up appt for ortho for your   BP ok but I recommend going back to 10 mg amlodipine   - can cause some ankle swelling If that happens again let me know We could always try a different combo

## 2019-07-11 ENCOUNTER — Other Ambulatory Visit: Payer: Self-pay | Admitting: Family Medicine

## 2019-07-13 ENCOUNTER — Encounter: Payer: Self-pay | Admitting: Family Medicine

## 2019-07-17 ENCOUNTER — Telehealth: Payer: Self-pay | Admitting: Family Medicine

## 2019-07-17 NOTE — Telephone Encounter (Signed)
Referral note is not clear - PT with ortho office called for clarification   Pt with possible bicep rupture.  Does patient need PT and ortho consult?  Or ortho consult 1st & let ortho surgeon decide on PT with plan of care  Please advise

## 2019-07-17 NOTE — Telephone Encounter (Signed)
Called & notified Michael Velazquez, she states she will forward so that the ortho consult can be scheduled

## 2019-07-17 NOTE — Telephone Encounter (Signed)
Ortho consult first please

## 2019-07-20 ENCOUNTER — Other Ambulatory Visit: Payer: Self-pay | Admitting: Family Medicine

## 2019-08-16 ENCOUNTER — Encounter: Payer: Self-pay | Admitting: Family Medicine

## 2019-09-10 ENCOUNTER — Telehealth: Payer: Self-pay | Admitting: Family Medicine

## 2019-09-10 NOTE — Telephone Encounter (Signed)
Please call patient  Remind him he needs to do his blood work he needs to complete this within the next 30 days It was ordered back in December

## 2019-09-11 NOTE — Telephone Encounter (Signed)
Spoke with pt wife Beth (DPR) and informed to her to remind patient to have lab work completed within the next 30 days. Pt wife verbalized understanding.

## 2019-09-12 ENCOUNTER — Other Ambulatory Visit: Payer: Self-pay | Admitting: Family Medicine

## 2019-09-27 ENCOUNTER — Other Ambulatory Visit: Payer: Self-pay | Admitting: Family Medicine

## 2019-10-25 ENCOUNTER — Other Ambulatory Visit: Payer: Self-pay | Admitting: Family Medicine

## 2019-10-25 NOTE — Telephone Encounter (Signed)
They have they have 90-day on each

## 2019-10-27 ENCOUNTER — Other Ambulatory Visit: Payer: Self-pay | Admitting: Family Medicine

## 2019-12-04 ENCOUNTER — Other Ambulatory Visit: Payer: Self-pay | Admitting: *Deleted

## 2019-12-04 DIAGNOSIS — E119 Type 2 diabetes mellitus without complications: Secondary | ICD-10-CM

## 2019-12-04 DIAGNOSIS — Z79899 Other long term (current) drug therapy: Secondary | ICD-10-CM

## 2019-12-04 DIAGNOSIS — E7849 Other hyperlipidemia: Secondary | ICD-10-CM

## 2019-12-04 DIAGNOSIS — Z125 Encounter for screening for malignant neoplasm of prostate: Secondary | ICD-10-CM

## 2019-12-04 DIAGNOSIS — I10 Essential (primary) hypertension: Secondary | ICD-10-CM

## 2019-12-05 MED ORDER — SITAGLIPTIN PHOSPHATE 100 MG PO TABS: 100.0000 mg | ORAL_TABLET | Freq: Every day | ORAL | 1 refills | Status: DC

## 2019-12-05 NOTE — Telephone Encounter (Signed)
Patient did not do his lab work Recommend lipid, liver, metabolic 7, urine ACR, PSA, A1c May have this and in 1 additional  refill needs to do his lab work needs to do a follow-up visit

## 2019-12-17 ENCOUNTER — Other Ambulatory Visit: Payer: Self-pay | Admitting: Family Medicine

## 2020-01-10 ENCOUNTER — Other Ambulatory Visit: Payer: Self-pay | Admitting: *Deleted

## 2020-01-10 MED ORDER — ATORVASTATIN CALCIUM 10 MG PO TABS: 10.0000 mg | ORAL_TABLET | Freq: Every day | ORAL | 0 refills | Status: DC

## 2020-01-15 ENCOUNTER — Other Ambulatory Visit: Payer: Self-pay | Admitting: Family Medicine

## 2020-01-15 ENCOUNTER — Other Ambulatory Visit: Payer: Self-pay

## 2020-01-15 ENCOUNTER — Ambulatory Visit (INDEPENDENT_AMBULATORY_CARE_PROVIDER_SITE_OTHER): Payer: Medicare Other | Admitting: Family Medicine

## 2020-01-15 ENCOUNTER — Encounter: Payer: Self-pay | Admitting: Family Medicine

## 2020-01-15 VITALS — BP 134/74 | Temp 97.6°F | Wt 153.8 lb

## 2020-01-15 DIAGNOSIS — E119 Type 2 diabetes mellitus without complications: Secondary | ICD-10-CM | POA: Diagnosis not present

## 2020-01-15 DIAGNOSIS — R0609 Other forms of dyspnea: Secondary | ICD-10-CM

## 2020-01-15 DIAGNOSIS — I10 Essential (primary) hypertension: Secondary | ICD-10-CM | POA: Diagnosis not present

## 2020-01-15 DIAGNOSIS — R0600 Dyspnea, unspecified: Secondary | ICD-10-CM

## 2020-01-15 DIAGNOSIS — E7849 Other hyperlipidemia: Secondary | ICD-10-CM

## 2020-01-15 DIAGNOSIS — E1121 Type 2 diabetes mellitus with diabetic nephropathy: Secondary | ICD-10-CM

## 2020-01-15 LAB — POCT GLYCOSYLATED HEMOGLOBIN (HGB A1C): Hemoglobin A1C: 7.4 % — AB (ref 4.0–5.6)

## 2020-01-15 NOTE — Progress Notes (Signed)
Subjective:    Patient ID: Michael Velazquez, male    DOB: 23-Mar-1946, 73 y.o.   MRN: [id number]  Diabetes He presents for his follow-up diabetic visit. He has type 2 diabetes mellitus. There are no hypoglycemic associated symptoms. Pertinent negatives for hypoglycemia include no confusion, dizziness or headaches. There are no diabetic associated symptoms. Pertinent negatives for diabetes include no chest pain and no fatigue. There are no hypoglycemic complications. There are no diabetic complications. Risk factors for coronary artery disease include hypertension. Current diabetic treatment includes oral agent (triple therapy). Compliance with diabetes treatment: taking Metformin,Glipizide, Januvia  and checking sugars at least once a week. He does not see a podiatrist.Eye exam is not current.   Strenuous work causes pt to give out of breath easily. Pt has noticed this more in the past 6 months to one year. Pt states he knows a lot of this has to be age but is not sure if anything else could be going on.   Results for orders placed or performed in visit on 01/15/20  POCT HgB A1C  Result Value Ref Range   Hemoglobin A1C 7.4 (A) 4.0 - 5.6 %   HbA1c POC (<> result, manual entry)     HbA1c, POC (prediabetic range)     HbA1c, POC (controlled diabetic range)     Worse with going up a hill Cutting grass  Review of Systems  Constitutional: Negative for diaphoresis and fatigue.  HENT: Negative for congestion and rhinorrhea.   Respiratory: Positive for shortness of breath. Negative for cough.   Cardiovascular: Negative for chest pain and leg swelling.  Gastrointestinal: Negative for abdominal pain and diarrhea.  Skin: Negative for color change and rash.  Neurological: Negative for dizziness and headaches.  Psychiatric/Behavioral: Negative for behavioral problems and confusion.       Objective:   Physical Exam Vitals reviewed.  Constitutional:      General: He is not in acute  distress. HENT:     Head: Normocephalic and atraumatic.  Eyes:     General:        Right eye: No discharge.        Left eye: No discharge.  Neck:     Trachea: No tracheal deviation.  Cardiovascular:     Rate and Rhythm: Normal rate and regular rhythm.     Heart sounds: Normal heart sounds. No murmur heard.   Pulmonary:     Effort: Pulmonary effort is normal. No respiratory distress.     Breath sounds: Normal breath sounds.  Lymphadenopathy:     Cervical: No cervical adenopathy.  Skin:    General: Skin is warm and dry.  Neurological:     Mental Status: He is alert.     Coordination: Coordination normal.  Psychiatric:        Behavior: Behavior normal.    EKG no acute changes.  Similar changes 15,017 Does have some irregularity with PACs. Poor Q wave progression  Fall Risk  07/06/2019 01/21/2017 12/24/2015 11/02/2014  Falls in the past year? 0 No No No  Follow up Falls evaluation completed - - -       Assessment & Plan:  Patient needs further looking into regarding DOE it is possible this could be underlying coronary artery disease Very important for the patient to see cardiology referral made patient will call us back regarding who he would like to see if he will talk with his wife  1. Diabetes mellitus without complication ([place]) His W0J could be  better but it is somewhat better compared to previous he will work hard on dietary measures. - POCT HgB A1C  2. DOE (dyspnea on exertion) This is concerning for the possibility of underlying coronary artery disease referral to cardiology await results see above - EKG 12-Lead  3. Essential hypertension Blood pressure good control continue current measures  4. Diabetic nephropathy with proteinuria ([place]) Patient to do lab work await the results  5. Other hyperlipidemia To do lab work await results continue medication

## 2020-01-16 ENCOUNTER — Encounter: Payer: Self-pay | Admitting: Family Medicine

## 2020-01-18 ENCOUNTER — Encounter: Payer: Self-pay | Admitting: Family Medicine

## 2020-01-21 NOTE — Progress Notes (Signed)
Please initiate referral to Dr. Bronson Ing for DOE and underlying diabetes

## 2020-01-23 ENCOUNTER — Encounter: Payer: Self-pay | Admitting: Family Medicine

## 2020-01-23 ENCOUNTER — Other Ambulatory Visit: Payer: Self-pay | Admitting: Family Medicine

## 2020-01-23 ENCOUNTER — Other Ambulatory Visit: Payer: Self-pay | Admitting: *Deleted

## 2020-01-23 DIAGNOSIS — R0609 Other forms of dyspnea: Secondary | ICD-10-CM

## 2020-01-23 DIAGNOSIS — E119 Type 2 diabetes mellitus without complications: Secondary | ICD-10-CM

## 2020-03-01 ENCOUNTER — Other Ambulatory Visit: Payer: Self-pay

## 2020-03-01 MED ORDER — SITAGLIPTIN PHOSPHATE 100 MG PO TABS: 100.0000 mg | ORAL_TABLET | Freq: Every day | ORAL | 1 refills | Status: DC

## 2020-03-13 ENCOUNTER — Other Ambulatory Visit: Payer: Self-pay | Admitting: Family Medicine

## 2020-03-15 ENCOUNTER — Encounter: Payer: Self-pay | Admitting: *Deleted

## 2020-03-18 ENCOUNTER — Encounter: Payer: Self-pay | Admitting: Cardiology

## 2020-03-18 ENCOUNTER — Other Ambulatory Visit: Payer: Self-pay

## 2020-03-18 ENCOUNTER — Ambulatory Visit (INDEPENDENT_AMBULATORY_CARE_PROVIDER_SITE_OTHER): Payer: Medicare Other | Admitting: Cardiology

## 2020-03-18 ENCOUNTER — Encounter: Payer: Self-pay | Admitting: *Deleted

## 2020-03-18 ENCOUNTER — Telehealth: Payer: Self-pay | Admitting: Cardiology

## 2020-03-18 VITALS — BP 150/80 | HR 82 | Ht 66.0 in | Wt 155.0 lb

## 2020-03-18 DIAGNOSIS — I10 Essential (primary) hypertension: Secondary | ICD-10-CM

## 2020-03-18 DIAGNOSIS — R011 Cardiac murmur, unspecified: Secondary | ICD-10-CM | POA: Diagnosis not present

## 2020-03-18 DIAGNOSIS — R0609 Other forms of dyspnea: Secondary | ICD-10-CM

## 2020-03-18 DIAGNOSIS — R0600 Dyspnea, unspecified: Secondary | ICD-10-CM

## 2020-03-18 DIAGNOSIS — E1165 Type 2 diabetes mellitus with hyperglycemia: Secondary | ICD-10-CM

## 2020-03-18 NOTE — Progress Notes (Signed)
Cardiology Office Note  Date: 03/18/2020   ID: Trayon, Krantz 31-May-1946, MRN [id number]  PCP:  Kathyrn Drown, MD  Cardiologist:  Rozann Lesches, MD Electrophysiologist:  None   Chief Complaint  Patient presents with  . Shortness of Breath    History of Present Illness: Michael Velazquez is a 74 y.o. male referred for cardiology consultation by Dr. Wolfgang Phoenix for the evaluation of dyspnea on exertion.  He presents today for evaluation.  States that over the last year he has noticed intermittent episodes of dyspnea on exertion and a "stinging" discomfort in his chest.  In most cases this happens when he is doing yard work, specifically walking up an incline while mowing the yard or doing more strenuous activities.  Usually, he stops for a few minutes and then is able to continue on.  He reports no personal history of cardiovascular disease, does have a known history of type 2 diabetes mellitus and hypertension.  His father had heart disease and underwent CABG in his 23s.  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  I reviewed his medications which are outlined below.  I also personally reviewed his ECG which shows sinus rhythm with PACs, right bundle branch block, left anterior fascicular block.  Past Medical History:  Diagnosis Date  . Essential hypertension   . Type 2 diabetes mellitus ([place])     Past Surgical History:  Procedure Laterality Date  . No prior surgery      Current Outpatient Medications  Medication Sig Dispense Refill  . amLODipine (NORVASC) 10 MG tablet TAKE 1 TABLET BY MOUTH EVERY DAY 90 tablet 0  . aspirin 81 MG tablet Take 81 mg by mouth daily.    Marland Kitchen atenolol (TENORMIN) 50 MG tablet TAKE 1 TABLET BY MOUTH TWICE DAILY 180 tablet 0  . atorvastatin (LIPITOR) 10 MG tablet Take 1 tablet (10 mg total) by mouth daily. 90 tablet 0  . EPINEPHrine 0.3 mg/0.3 mL IJ SOAJ injection Inject into the muscle once.    Marland Kitchen glipiZIDE (GLUCOTROL XL) 2.5  MG 24 hr tablet TAKE 1 TABLET BY MOUTH EVERY DAY WITH BREAKFAST 90 tablet 1  . lisinopril-hydrochlorothiazide (ZESTORETIC) 20-25 MG tablet TAKE 1 TABLET BY MOUTH EVERY DAY 90 tablet 0  . metFORMIN (GLUCOPHAGE) 1000 MG tablet TAKE 1 TABLET BY MOUTH EVERY DAY 90 tablet 0  . sildenafil (VIAGRA) 100 MG tablet Take 1/2 -1 tablet po prn 3 tablet 0  . sitaGLIPtin (JANUVIA) 100 MG tablet Take 1 tablet (100 mg total) by mouth daily. 30 tablet 1   No current facility-administered medications for this visit.   Allergies:  Yellow jacket venom [bee venom]   Social History: The patient  reports that he has never smoked. He has never used smokeless tobacco. He reports current alcohol use. He reports that he does not use drugs.   Family History: The patient's family history includes Heart attack in his father; Hyperlipidemia in his mother; Hypertension in his mother.   ROS:   No palpitations or syncope.  Physical Exam: VS:  BP (!) 150/80   Pulse 82   Ht 5\' 6"  (1.676 m)   Wt 155 lb (70.3 kg)   SpO2 98%   BMI 25.02 kg/m , BMI Body mass index is 25.02 kg/m.  Wt Readings from Last 3 Encounters:  03/18/20 155 lb (70.3 kg)  01/15/20 153 lb 12.8 oz (69.8 kg)  07/06/19 159 lb 12.8 oz (72.5 kg)  General: Patient appears comfortable at rest. HEENT: Conjunctiva and lids normal, wearing a mask. Neck: Supple, no elevated JVP or carotid bruits, no thyromegaly. Lungs: Clear to auscultation, nonlabored breathing at rest. Cardiac: Regular rate and rhythm with ectopy, no S3, 4-6/8 apical systolic murmur, no pericardial rub. Abdomen: Soft, nontender, bowel sounds present. Extremities: No pitting edema, distal pulses 2+. Skin: Warm and dry. Musculoskeletal: No kyphosis. Neuropsychiatric: Alert and oriented x3, affect grossly appropriate.  ECG:  An ECG dated 01/15/2020 was personally reviewed today and demonstrated:  Sinus rhythm with PAC, right bundle branch block, nonspecific T wave changes.  Recent  Labwork:    Component Value Date/Time   CHOL 110 10/05/2018 1137   TRIG 114 10/05/2018 1137   HDL 37 (L) 10/05/2018 1137   CHOLHDL 3.0 10/05/2018 1137   LDLCALC 50 10/05/2018 1137  June 2021: Hemoglobin A1c 7.4%  Other Studies Reviewed Today:  No prior cardiac testing for review today.  Assessment and Plan:  1.  Intermittent dyspnea on exertion and atypical chest pain in a 74 year old male with personal history of type 2 diabetes mellitus and hypertension.  Also history of premature CAD in his father.  ECG shows right bundle branch block and left anterior fascicular block, occasional PACs (no complaints of palpitations).  He has a cardiac murmur suggesting mitral regurgitation.  Plan is to proceed with a Lexiscan Myoview for ischemic evaluation and an echocardiogram for cardiac structural assessment.  Further recommendations to follow.  2.  Type 2 diabetes mellitus, recent hemoglobin A1c 7.4%.  He is on Glucotrol XL, Glucophage and Januvia with follow-up by Dr. Wolfgang Phoenix.  3.  Essential hypertension, on atenolol and Norvasc.  Blood pressure elevated today.  Medication Adjustments/Labs and Tests Ordered: Current medicines are reviewed at length with the patient today.  Concerns regarding medicines are outlined above.   Tests Ordered: Orders Placed This Encounter  Procedures  . NM Myocar Multi W/Spect W/Wall Motion / EF  . EKG 12-Lead  . ECHOCARDIOGRAM COMPLETE    Medication Changes: No orders of the defined types were placed in this encounter.   Disposition:  Follow up test results and determine next step.  Signed, Satira Sark, MD, [facility] 03/18/2020 11:24 AM    [facility] [place], [place], [place] [address] Phone: [phone]; Fax: [phone]

## 2020-03-18 NOTE — Addendum Note (Signed)
Addended by: Merlene Laughter on: 03/18/2020 11:29 AM   Modules accepted: Orders

## 2020-03-18 NOTE — Patient Instructions (Addendum)
Medication Instructions:   Your physician recommends that you continue on your current medications as directed. Please refer to the Current Medication list given to you today.  Labwork:  NONE  Testing/Procedures: Your physician has requested that you have an echocardiogram. Echocardiography is a painless test that uses sound waves to create images of your heart. It provides your doctor with information about the size and shape of your heart and how well your heart's chambers and valves are working. This procedure takes approximately one hour. There are no restrictions for this procedure. Your physician has requested that you have a lexiscan myoview. For further information please visit [email or web address]. Please follow instruction sheet, as given.  Follow-Up:  Your physician recommends that you schedule a follow-up appointment in:   Any Other Special Instructions Will Be Listed Below (If Applicable).  If you need a refill on your cardiac medications before your next appointment, please call your pharmacy.

## 2020-03-18 NOTE — Telephone Encounter (Signed)
Pre-cert Verification for the following procedure    1. LEXISCAN 2. ECHO   DATE:  04/16/2020  LOCATION: [facility]

## 2020-03-31 ENCOUNTER — Other Ambulatory Visit: Payer: Self-pay | Admitting: Family Medicine

## 2020-04-16 ENCOUNTER — Encounter (HOSPITAL_COMMUNITY)
Admission: RE | Admit: 2020-04-16 | Discharge: 2020-04-16 | Disposition: A | Discharge status: Home / Self Care | Payer: Medicare Other | Source: Ambulatory Visit | Attending: Cardiology | Admitting: Cardiology

## 2020-04-16 ENCOUNTER — Ambulatory Visit (HOSPITAL_COMMUNITY)
Admission: RE | Admit: 2020-04-16 | Discharge: 2020-04-16 | Disposition: A | Discharge status: Home / Self Care | Payer: Medicare Other | Source: Ambulatory Visit | Attending: Cardiology | Admitting: Cardiology

## 2020-04-16 ENCOUNTER — Encounter (HOSPITAL_COMMUNITY): Payer: Self-pay

## 2020-04-16 ENCOUNTER — Ambulatory Visit (HOSPITAL_BASED_OUTPATIENT_CLINIC_OR_DEPARTMENT_OTHER)
Admission: RE | Admit: 2020-04-16 | Discharge: 2020-04-16 | Disposition: A | Discharge status: Home / Self Care | Payer: Medicare Other | Source: Ambulatory Visit | Attending: Cardiology | Admitting: Cardiology

## 2020-04-16 ENCOUNTER — Other Ambulatory Visit: Payer: Self-pay

## 2020-04-16 ENCOUNTER — Other Ambulatory Visit: Payer: Self-pay | Admitting: Family Medicine

## 2020-04-16 DIAGNOSIS — R0600 Dyspnea, unspecified: Secondary | ICD-10-CM

## 2020-04-16 DIAGNOSIS — R011 Cardiac murmur, unspecified: Secondary | ICD-10-CM | POA: Diagnosis not present

## 2020-04-16 DIAGNOSIS — R0609 Other forms of dyspnea: Secondary | ICD-10-CM

## 2020-04-16 LAB — ECHOCARDIOGRAM COMPLETE
AR max vel: 2.18 cm2
AV Area VTI: 2.16 cm2
AV Area mean vel: 2.04 cm2
AV Mean grad: 4.3 mmHg
AV Peak grad: 7.5 mmHg
Ao pk vel: 1.37 m/s
Area-P 1/2: 3.33 cm2
S' Lateral: 2.82 cm

## 2020-04-16 LAB — NM MYOCAR MULTI W/SPECT W/WALL MOTION / EF
LV dias vol: 108 mL (ref 62–150)
LV sys vol: 43 mL
RATE: 0.27
SDS: 1
SRS: 5
SSS: 6
TID: 1

## 2020-04-16 MED ORDER — TECHNETIUM TC 99M TETROFOSMIN IV KIT
10.0000 | PACK | Freq: Once | INTRAVENOUS | Status: AC | PRN
  Administered 2020-04-16: 11 via INTRAVENOUS

## 2020-04-16 MED ORDER — SODIUM CHLORIDE FLUSH 0.9 % IV SOLN
INTRAVENOUS | Status: AC
  Administered 2020-04-16: 10 mL via INTRAVENOUS
  Filled 2020-04-16: qty 10

## 2020-04-16 MED ORDER — TECHNETIUM TC 99M TETROFOSMIN IV KIT
30.0000 | PACK | Freq: Once | INTRAVENOUS | Status: AC | PRN
  Administered 2020-04-16: 31 via INTRAVENOUS

## 2020-04-16 MED ORDER — REGADENOSON 0.4 MG/5ML IV SOLN
INTRAVENOUS | Status: AC
  Administered 2020-04-16: 0.4 mg via INTRAVENOUS
  Filled 2020-04-16: qty 5

## 2020-04-16 NOTE — Progress Notes (Signed)
*  PRELIMINARY RESULTS* Echocardiogram 2D Echocardiogram has been performed.  Michael Velazquez 04/16/2020, 1:16 PM

## 2020-04-17 ENCOUNTER — Telehealth: Payer: Self-pay | Admitting: *Deleted

## 2020-04-17 NOTE — Telephone Encounter (Signed)
Patient's wife informed and verbalized understanding of plan. Copy sent to PCP °

## 2020-04-17 NOTE — Telephone Encounter (Signed)
-----   Message from Satira Sark, MD sent at 04/16/2020  7:42 PM EDT ----- Results reviewed.  Ejection fraction is normal at 60 to 65%.  Also only mild mitral regurgitation.  Please see Myoview test results for next step.

## 2020-04-17 NOTE — Telephone Encounter (Signed)
-----   Message from Satira Sark, MD sent at 04/16/2020  7:42 PM EDT ----- Results reviewed.  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  I would suggest that we pursue a diagnostic cardiac catheterization as a next step.  Please check baseline CBC and BMET as there has been no recent lab work otherwise.  Will defer initiating anticoagulation until after procedure.

## 2020-04-18 ENCOUNTER — Encounter: Payer: Self-pay | Admitting: Cardiology

## 2020-04-18 ENCOUNTER — Ambulatory Visit: Payer: Medicare Other | Admitting: Cardiology

## 2020-04-18 ENCOUNTER — Encounter: Payer: Self-pay | Admitting: Family Medicine

## 2020-04-18 ENCOUNTER — Telehealth: Payer: Self-pay | Admitting: Cardiology

## 2020-04-18 ENCOUNTER — Other Ambulatory Visit: Payer: Self-pay | Admitting: Cardiology

## 2020-04-18 ENCOUNTER — Encounter: Payer: Self-pay | Admitting: *Deleted

## 2020-04-18 VITALS — BP 162/90 | HR 63 | Ht 66.0 in | Wt 156.0 lb

## 2020-04-18 DIAGNOSIS — I480 Paroxysmal atrial fibrillation: Secondary | ICD-10-CM | POA: Diagnosis not present

## 2020-04-18 DIAGNOSIS — R9439 Abnormal result of other cardiovascular function study: Secondary | ICD-10-CM

## 2020-04-18 MED ORDER — SODIUM CHLORIDE 0.9% FLUSH: 3.0000 mL | Freq: Two times a day (BID) | INTRAVENOUS | Status: DC

## 2020-04-18 NOTE — Progress Notes (Addendum)
Cardiology Office Note  Date: 04/18/2020   ID: Prather, Failla Nov 03, 1945, MRN [id number]  PCP:  Kathyrn Drown, MD  Cardiologist:  Rozann Lesches, MD Electrophysiologist:  None   Chief Complaint  Patient presents with  . Follow-up cardiac testing    History of Present Illness: Michael Velazquez is a 74 y.o. male that I met recently in consultation in August for evaluation of dyspnea on exertion and atypical chest pain in the setting of type 2 diabetes mellitus and hypertension.  Lexiscan Myoview was recently performed on September 21.  He was incidentally noted to be in rate controlled atrial fibrillation during that study, a new diagnosis.  Furthermore, he had evidence of inferior wall scar with mild to moderate periinfarct ischemia.  Echocardiogram revealed LVEF 60 to 65%, no major valvular abnormalities.  He is here today with his wife for further discussion.  We went over the results and discussed proceeding with a diagnostic cardiac catheterization as a next step to clarify coronary anatomy and determine if there are any revascularization options to consider.  We reviewed the risks and benefits and he is in agreement to proceed.  I also talked with him about paroxysmal atrial fibrillation and stroke prophylaxis.  CHA2DS2-VASc score is 3-4.  For now we will continue aspirin and weight addition of DOAC until after his cardiac catheterization.  I personally reviewed his ECG today which shows sinus rhythm with PACs, right bundle branch block, left anterior fascicular block.  Past Medical History:  Diagnosis Date  . Essential hypertension   . Type 2 diabetes mellitus ([place])     Past Surgical History:  Procedure Laterality Date  . No prior surgery      Current Outpatient Medications  Medication Sig Dispense Refill  . amLODipine (NORVASC) 10 MG tablet TAKE 1 TABLET BY MOUTH EVERY DAY 90 tablet 0  . aspirin 81 MG tablet Take 81 mg by mouth daily.    Marland Kitchen atenolol  (TENORMIN) 50 MG tablet TAKE 1 TABLET BY MOUTH TWICE DAILY 180 tablet 0  . atorvastatin (LIPITOR) 10 MG tablet TAKE 1 TABLET BY MOUTH DAILY 90 tablet 0  . EPINEPHrine 0.3 mg/0.3 mL IJ SOAJ injection Inject into the muscle once.    Marland Kitchen lisinopril-hydrochlorothiazide (ZESTORETIC) 20-25 MG tablet TAKE 1 TABLET BY MOUTH EVERY DAY 90 tablet 0  . metFORMIN (GLUCOPHAGE) 1000 MG tablet TAKE 1 TABLET BY MOUTH EVERY DAY 90 tablet 0  . sildenafil (VIAGRA) 100 MG tablet Take 1/2 -1 tablet po prn 3 tablet 0  . sitaGLIPtin (JANUVIA) 100 MG tablet Take 1 tablet (100 mg total) by mouth daily. 30 tablet 1   No current facility-administered medications for this visit.   Allergies:  Yellow jacket venom [bee venom]   Social History: The patient  reports that he has never smoked. He has never used smokeless tobacco. He reports current alcohol use. He reports that he does not use drugs.   Family History: The patient's family history includes Heart attack in his father; Hyperlipidemia in his mother; Hypertension in his mother.   ROS:   No definite sense of palpitations, no syncope.  Physical Exam: VS:  BP (!) 162/90   Pulse 63   Ht $R'5\' 6"'Wg$  (1.676 m)   Wt 156 lb (70.8 kg)   SpO2 98%   BMI 25.18 kg/m , BMI Body mass index is 25.18 kg/m.  Wt Readings from Last 3 Encounters:  04/18/20 156 lb (70.8 kg)  03/18/20 155 lb (70.3 kg)  01/15/20 153 lb 12.8 oz (69.8 kg)    General: Patient appears comfortable at rest. HEENT: Conjunctiva and lids normal, wearing a mask. Neck: Supple, no elevated JVP or carotid bruits, no thyromegaly. Lungs: Clear to auscultation, nonlabored breathing at rest. Cardiac: Regular rate and rhythm, no S3, 2/6 apical systolic murmur. Abdomen: Soft, bowel sounds present, no guarding or rebound. Extremities: No pitting edema, distal pulses 2+.  ECG:  An ECG dated 03/18/2020 was personally reviewed today and demonstrated:  Sinus rhythm with PACs, right bundle branch block, left anterior  fascicular block.  Recent Labwork:    Component Value Date/Time   CHOL 110 10/05/2018 1137   TRIG 114 10/05/2018 1137   HDL 37 (L) 10/05/2018 1137   CHOLHDL 3.0 10/05/2018 1137   LDLCALC 50 10/05/2018 1137  June 2021: Hemoglobin A1c 7.4%  Other Studies Reviewed Today:  Echocardiogram 04/16/2020: 1. Left ventricular ejection fraction, by estimation, is 60 to 65%. The  left ventricle has normal function. The left ventricle has no regional  wall motion abnormalities. There is mild left ventricular hypertrophy.  Left ventricular diastolic parameters  are indeterminate.  2. Right ventricular systolic function is normal. The right ventricular  size is normal.  3. Left atrial size was mildly dilated.  4. The mitral valve is normal in structure. Mild mitral valve  regurgitation. No evidence of mitral stenosis.  5. The aortic valve is tricuspid. Aortic valve regurgitation is not  visualized. No aortic stenosis is present.  6. The inferior vena cava is normal in size with greater than 50%  respiratory variability, suggesting right atrial pressure of 3 mmHg.   Lexiscan Myoview 04/16/2020:  Patient noted to be in rate controlled atrial fibrillation throughout study, this appears to be a new diagnosis for him.  There was no ST segment deviation noted during stress.  No T wave inversion was noted during stress.  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  The left ventricular ejection fraction is normal (55-65%).  Low to intermediate risk study  Assessment and Plan:  1.  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  LVEF is normal at 60 to 65% by echocardiography and he has only mild mitral regurgitation.  He has experienced dyspnea on exertion with atypical chest pain in the setting of type 2 diabetes mellitus and hypertension.  After reviewing the risks and  benefits, plan is to proceed with a diagnostic cardiac catheterization to assess for revascularization options.  Aspirin, atenolol, Norvasc, and Lipitor.  2.  Paroxysmal atrial fibrillation, rhythm newly documented during Myoview.  It is certainly possible that he has been having symptoms related to intermittent arrhythmia, but need to clarify coronary anatomy first.  CHA2DS2-VASc score is 3-4.  DOAC can be initiated after cardiac catheterization.  We may even want to consider longer-term monitoring since his ECGs in clinic with me have shown sinus rhythm with PACs.  3.  Type 2 diabetes mellitus, followed by Dr. Wolfgang Phoenix.  Last hemoglobin A1c 7.4% on Glucotrol XL and Glucophage as well as Januvia.  He would be a good candidate for SGLT2i.  Medication Adjustments/Labs and Tests Ordered: Current medicines are reviewed at length with the patient today.  Concerns regarding medicines are outlined above.   Tests Ordered: Orders Placed This Encounter  Procedures  . CBC  . Basic Metabolic Panel (BMET)  . EKG 12-Lead    Medication Changes: No orders of the defined types were placed in this  encounter.   Disposition:  Follow up after procedure.  Signed, Satira Sark, MD, [facility] 04/18/2020 2:33 PM    [facility] at [place], [place], [place] [address] Phone: [phone]; Fax: [phone]

## 2020-04-18 NOTE — Telephone Encounter (Signed)
Pre-cert Verification for the following procedure    [facility] 9/298:30am DR Tamala Julian

## 2020-04-18 NOTE — Patient Instructions (Addendum)
Your physician recommends that you schedule a follow-up appointment in: [place]   Your physician recommends that you continue on your current medications as directed. Please refer to the Current Medication list given to you today.  Your physician has requested that you have a cardiac catheterization. Cardiac catheterization is used to diagnose and/or treat various heart conditions. Doctors may recommend this procedure for a number of different reasons. The most common reason is to evaluate chest pain. Chest pain can be a symptom of coronary artery disease (CAD), and cardiac catheterization can show whether plaque is narrowing or blocking your heart's arteries. This procedure is also used to evaluate the valves, as well as measure the blood flow and oxygen levels in different parts of your heart. For further information please visit HugeFiesta.[email or web address]. Please follow instruction sheet, as given.  Thank you for choosing [facility]!!

## 2020-04-19 ENCOUNTER — Telehealth: Payer: Self-pay | Admitting: Family Medicine

## 2020-04-19 ENCOUNTER — Other Ambulatory Visit: Payer: Self-pay | Admitting: *Deleted

## 2020-04-19 DIAGNOSIS — Z125 Encounter for screening for malignant neoplasm of prostate: Secondary | ICD-10-CM

## 2020-04-19 DIAGNOSIS — E7849 Other hyperlipidemia: Secondary | ICD-10-CM

## 2020-04-19 DIAGNOSIS — E119 Type 2 diabetes mellitus without complications: Secondary | ICD-10-CM

## 2020-04-19 DIAGNOSIS — I10 Essential (primary) hypertension: Secondary | ICD-10-CM

## 2020-04-19 DIAGNOSIS — Z79899 Other long term (current) drug therapy: Secondary | ICD-10-CM

## 2020-04-19 NOTE — Telephone Encounter (Signed)
Wife notified, labs ordered.

## 2020-04-19 NOTE — Telephone Encounter (Signed)
Nurses Please connect with patient Tell pt I am aware of his upcoming cardio procedures  In the near future (after the heart testing) pt needs to do  A1C,Met7,Lipid,Liver,PSA and follow up ov to adjust his treatment regimen

## 2020-04-22 ENCOUNTER — Other Ambulatory Visit: Payer: Self-pay

## 2020-04-22 ENCOUNTER — Other Ambulatory Visit (HOSPITAL_COMMUNITY)
Admission: RE | Admit: 2020-04-22 | Discharge: 2020-04-22 | Disposition: A | Discharge status: Home / Self Care | Payer: Medicare Other | Source: Ambulatory Visit | Attending: Cardiology | Admitting: Cardiology

## 2020-04-22 DIAGNOSIS — D696 Thrombocytopenia, unspecified: Secondary | ICD-10-CM | POA: Diagnosis not present

## 2020-04-22 DIAGNOSIS — N289 Disorder of kidney and ureter, unspecified: Secondary | ICD-10-CM | POA: Diagnosis not present

## 2020-04-22 DIAGNOSIS — Z7982 Long term (current) use of aspirin: Secondary | ICD-10-CM | POA: Diagnosis not present

## 2020-04-22 DIAGNOSIS — I25110 Atherosclerotic heart disease of native coronary artery with unstable angina pectoris: Secondary | ICD-10-CM | POA: Diagnosis not present

## 2020-04-22 DIAGNOSIS — R001 Bradycardia, unspecified: Secondary | ICD-10-CM | POA: Diagnosis not present

## 2020-04-22 DIAGNOSIS — Z91030 Bee allergy status: Secondary | ICD-10-CM | POA: Diagnosis not present

## 2020-04-22 DIAGNOSIS — E785 Hyperlipidemia, unspecified: Secondary | ICD-10-CM | POA: Diagnosis not present

## 2020-04-22 DIAGNOSIS — E119 Type 2 diabetes mellitus without complications: Secondary | ICD-10-CM | POA: Diagnosis not present

## 2020-04-22 DIAGNOSIS — Z79899 Other long term (current) drug therapy: Secondary | ICD-10-CM | POA: Diagnosis not present

## 2020-04-22 DIAGNOSIS — Z7984 Long term (current) use of oral hypoglycemic drugs: Secondary | ICD-10-CM | POA: Diagnosis not present

## 2020-04-22 DIAGNOSIS — Z01812 Encounter for preprocedural laboratory examination: Secondary | ICD-10-CM | POA: Insufficient documentation

## 2020-04-22 DIAGNOSIS — Z20822 Contact with and (suspected) exposure to covid-19: Secondary | ICD-10-CM | POA: Insufficient documentation

## 2020-04-22 DIAGNOSIS — I10 Essential (primary) hypertension: Secondary | ICD-10-CM | POA: Diagnosis not present

## 2020-04-22 DIAGNOSIS — R9439 Abnormal result of other cardiovascular function study: Secondary | ICD-10-CM | POA: Insufficient documentation

## 2020-04-22 DIAGNOSIS — D62 Acute posthemorrhagic anemia: Secondary | ICD-10-CM | POA: Diagnosis not present

## 2020-04-22 DIAGNOSIS — I480 Paroxysmal atrial fibrillation: Secondary | ICD-10-CM | POA: Diagnosis not present

## 2020-04-22 LAB — BASIC METABOLIC PANEL
Anion gap: 11 (ref 5–15)
BUN: 23 mg/dL (ref 8–23)
CO2: 25 mmol/L (ref 22–32)
Calcium: 9.2 mg/dL (ref 8.9–10.3)
Chloride: 98 mmol/L (ref 98–111)
Creatinine, Ser: 1.25 mg/dL — ABNORMAL HIGH (ref 0.61–1.24)
GFR calc Af Amer: >60 mL/min (ref >60)
GFR calc non Af Amer: 57 mL/min — ABNORMAL LOW (ref >60)
Glucose, Bld: 235 mg/dL — ABNORMAL HIGH (ref 70–99)
Potassium: 4.2 mmol/L (ref 3.5–5.1)
Sodium: 134 mmol/L — ABNORMAL LOW (ref 135–145)

## 2020-04-22 LAB — CBC
HCT: 35.6 % — ABNORMAL LOW (ref 39.0–52.0)
Hemoglobin: 12.3 g/dL — ABNORMAL LOW (ref 13.0–17.0)
MCH: 30.5 pg (ref 26.0–34.0)
MCHC: 34.6 g/dL (ref 30.0–36.0)
MCV: 88.3 fL (ref 80.0–100.0)
Platelets: 230 10*3/uL (ref 150–400)
RBC: 4.03 MIL/uL — ABNORMAL LOW (ref 4.22–5.81)
RDW: 12.3 % (ref 11.5–15.5)
WBC: 10.3 10*3/uL (ref 4.0–10.5)
nRBC: 0 % (ref 0.0–0.2)

## 2020-04-23 ENCOUNTER — Telehealth: Payer: Self-pay | Admitting: *Deleted

## 2020-04-23 LAB — SARS CORONAVIRUS 2 (TAT 6-24 HRS): SARS Coronavirus 2: NEGATIVE

## 2020-04-23 NOTE — H&P (Signed)
   Diabetic with evidence of inferior infarction and peripheral ischemia.  New onset AF.  CHADS VASC 3. Awaiting anticoagulation.

## 2020-04-23 NOTE — Telephone Encounter (Signed)
-----   Message from Satira Sark, MD sent at 04/22/2020  5:52 PM EDT ----- Results reviewed.  Pre-cardiac catheterization lab work.  Creatinine 1.25 down from 1.32, hemoglobin 12.3.  Standard hydration ordered.

## 2020-04-23 NOTE — Telephone Encounter (Signed)
Laurine Blazer, LPN  3/34/3568 6:16 PM EDT Back to Top    [facility] doctor) notified. Copy to pcp.

## 2020-04-23 NOTE — Telephone Encounter (Signed)
Pt contacted pre-catheterization scheduled at [facility] for: Wednesday April 24, 2020 8:30 AM Verified arrival time and place: [facility]) at: 6:30 AM   No solid food after midnight prior to cath, clear liquids until 5 AM day of procedure.  Hold: Metformin-day of procedure and 48 hours post procedure Januvia-AM of procedure Lisinopril-HCT-day before and day of procedure-GFR 57-pt already taken today Aleve-day before and day of procedure-GFR 57 Ibuprofen PM-day before and day of procedure-GFR 57 Viagra- pt not currently using.  Except hold medications AM meds can be  taken pre-cath with sips of water including: ASA 81 mg   Confirmed patient has responsible adult to drive home post procedure and be with patient first 24 hours after arriving home: yes  You are allowed ONE visitor in the waiting room during the time you are at the hospital for your procedure. Both you and your visitor must wear a mask once you enter the hospital.       COVID-19 Pre-Screening Questions:  . In the past 14 days have you had a new cough, new headache, new nasal congestion, fever (100.4 or greater) unexplained body aches, new sore throat, or sudden loss of taste or sense of smell? no . In the past 14 days have you been around anyone with known Covid 19? no . Have you been vaccinated for COVID-19? Yes, see immunization history  Reviewed procedure/mask/visitor instructions, COVID-19 questions with patient.

## 2020-04-24 ENCOUNTER — Inpatient Hospital Stay (HOSPITAL_COMMUNITY): Payer: Medicare Other

## 2020-04-24 ENCOUNTER — Inpatient Hospital Stay (HOSPITAL_COMMUNITY)
Admission: AD | Admit: 2020-04-24 | Discharge: 2020-05-02 | DRG: 234 | Disposition: A | Discharge status: Home / Self Care | Payer: Medicare Other | Attending: Cardiothoracic Surgery | Admitting: Cardiothoracic Surgery

## 2020-04-24 ENCOUNTER — Other Ambulatory Visit: Payer: Self-pay

## 2020-04-24 ENCOUNTER — Encounter (HOSPITAL_COMMUNITY)
Admission: AD | Disposition: A | Discharge status: Home / Self Care | Payer: Medicare Other | Source: Home / Self Care | Attending: Cardiothoracic Surgery

## 2020-04-24 ENCOUNTER — Encounter (HOSPITAL_COMMUNITY): Payer: Self-pay | Admitting: Interventional Cardiology

## 2020-04-24 ENCOUNTER — Other Ambulatory Visit: Payer: Self-pay | Admitting: *Deleted

## 2020-04-24 DIAGNOSIS — I25118 Atherosclerotic heart disease of native coronary artery with other forms of angina pectoris: Secondary | ICD-10-CM

## 2020-04-24 DIAGNOSIS — E119 Type 2 diabetes mellitus without complications: Secondary | ICD-10-CM

## 2020-04-24 DIAGNOSIS — Z91030 Bee allergy status: Secondary | ICD-10-CM

## 2020-04-24 DIAGNOSIS — Z4682 Encounter for fitting and adjustment of non-vascular catheter: Secondary | ICD-10-CM | POA: Diagnosis not present

## 2020-04-24 DIAGNOSIS — I2510 Atherosclerotic heart disease of native coronary artery without angina pectoris: Secondary | ICD-10-CM

## 2020-04-24 DIAGNOSIS — N289 Disorder of kidney and ureter, unspecified: Secondary | ICD-10-CM | POA: Diagnosis present

## 2020-04-24 DIAGNOSIS — I480 Paroxysmal atrial fibrillation: Secondary | ICD-10-CM | POA: Diagnosis not present

## 2020-04-24 DIAGNOSIS — Z7982 Long term (current) use of aspirin: Secondary | ICD-10-CM

## 2020-04-24 DIAGNOSIS — Z01810 Encounter for preprocedural cardiovascular examination: Secondary | ICD-10-CM | POA: Diagnosis not present

## 2020-04-24 DIAGNOSIS — M47814 Spondylosis without myelopathy or radiculopathy, thoracic region: Secondary | ICD-10-CM | POA: Diagnosis not present

## 2020-04-24 DIAGNOSIS — Z7984 Long term (current) use of oral hypoglycemic drugs: Secondary | ICD-10-CM | POA: Diagnosis not present

## 2020-04-24 DIAGNOSIS — D696 Thrombocytopenia, unspecified: Secondary | ICD-10-CM | POA: Diagnosis not present

## 2020-04-24 DIAGNOSIS — Z79899 Other long term (current) drug therapy: Secondary | ICD-10-CM | POA: Diagnosis not present

## 2020-04-24 DIAGNOSIS — R001 Bradycardia, unspecified: Secondary | ICD-10-CM | POA: Diagnosis not present

## 2020-04-24 DIAGNOSIS — E781 Pure hyperglyceridemia: Secondary | ICD-10-CM | POA: Diagnosis present

## 2020-04-24 DIAGNOSIS — Z951 Presence of aortocoronary bypass graft: Secondary | ICD-10-CM

## 2020-04-24 DIAGNOSIS — I4891 Unspecified atrial fibrillation: Secondary | ICD-10-CM | POA: Diagnosis not present

## 2020-04-24 DIAGNOSIS — Z01818 Encounter for other preprocedural examination: Secondary | ICD-10-CM

## 2020-04-24 DIAGNOSIS — D62 Acute posthemorrhagic anemia: Secondary | ICD-10-CM | POA: Diagnosis not present

## 2020-04-24 DIAGNOSIS — J9811 Atelectasis: Secondary | ICD-10-CM

## 2020-04-24 DIAGNOSIS — R531 Weakness: Secondary | ICD-10-CM | POA: Diagnosis not present

## 2020-04-24 DIAGNOSIS — I10 Essential (primary) hypertension: Secondary | ICD-10-CM | POA: Diagnosis not present

## 2020-04-24 DIAGNOSIS — E785 Hyperlipidemia, unspecified: Secondary | ICD-10-CM | POA: Diagnosis present

## 2020-04-24 DIAGNOSIS — R9439 Abnormal result of other cardiovascular function study: Secondary | ICD-10-CM

## 2020-04-24 DIAGNOSIS — R918 Other nonspecific abnormal finding of lung field: Secondary | ICD-10-CM | POA: Diagnosis not present

## 2020-04-24 DIAGNOSIS — Z20822 Contact with and (suspected) exposure to covid-19: Secondary | ICD-10-CM | POA: Diagnosis present

## 2020-04-24 DIAGNOSIS — I25110 Atherosclerotic heart disease of native coronary artery with unstable angina pectoris: Principal | ICD-10-CM | POA: Diagnosis present

## 2020-04-24 DIAGNOSIS — I200 Unstable angina: Secondary | ICD-10-CM | POA: Diagnosis present

## 2020-04-24 HISTORY — PX: LEFT HEART CATH AND CORONARY ANGIOGRAPHY: CATH118249

## 2020-04-24 LAB — GLUCOSE, CAPILLARY
Glucose-Capillary: 252 mg/dL — ABNORMAL HIGH (ref 70–99)
Glucose-Capillary: 294 mg/dL — ABNORMAL HIGH (ref 70–99)
Glucose-Capillary: 166 mg/dL — ABNORMAL HIGH (ref 70–99)
Glucose-Capillary: 149 mg/dL — ABNORMAL HIGH (ref 70–99)

## 2020-04-24 LAB — URINALYSIS, ROUTINE W REFLEX MICROSCOPIC
Bacteria, UA: NONE SEEN
Bilirubin Urine: NEGATIVE
Glucose, UA: >=500 mg/dL — AB
Hgb urine dipstick: NEGATIVE
Ketones, ur: NEGATIVE mg/dL
Leukocytes,Ua: NEGATIVE
Nitrite: NEGATIVE
Protein, ur: 100 mg/dL — AB
Specific Gravity, Urine: 1.014 (ref 1.005–1.030)
pH: 6 (ref 5.0–8.0)

## 2020-04-24 LAB — PULMONARY FUNCTION TEST
FEF 25-75 Pre: 0.62 L/sec
FEF2575-%Pred-Pre: 30 %
FEV1-%Pred-Pre: 54 %
FEV1-Pre: 1.48 L
FEV1FVC-%Pred-Pre: 78 %
FEV6-%Pred-Pre: 70 %
FEV6-Pre: 2.43 L
FEV6FVC-%Pred-Pre: 101 %
FVC-%Pred-Pre: 69 %
FVC-Pre: 2.56 L
Pre FEV1/FVC ratio: 58 %
Pre FEV6/FVC Ratio: 95 %

## 2020-04-24 LAB — PREPARE RBC (CROSSMATCH)

## 2020-04-24 LAB — HEPATIC FUNCTION PANEL
ALT: 19 U/L (ref 0–44)
AST: 20 U/L (ref 15–41)
Albumin: 3.7 g/dL (ref 3.5–5.0)
Alkaline Phosphatase: 42 U/L (ref 38–126)
Bilirubin, Direct: 0.1 mg/dL (ref 0.0–0.2)
Indirect Bilirubin: 1.2 mg/dL — ABNORMAL HIGH (ref 0.3–0.9)
Total Bilirubin: 1.3 mg/dL — ABNORMAL HIGH (ref 0.3–1.2)
Total Protein: 7.2 g/dL (ref 6.5–8.1)

## 2020-04-24 LAB — PROTIME-INR
INR: 1.1 (ref 0.8–1.2)
Prothrombin Time: 13.3 seconds (ref 11.4–15.2)

## 2020-04-24 LAB — TSH: TSH: 1.744 u[IU]/mL (ref 0.350–4.500)

## 2020-04-24 LAB — ABO/RH: ABO/RH(D): A POS

## 2020-04-24 LAB — SURGICAL PCR SCREEN
MRSA, PCR: NEGATIVE
Staphylococcus aureus: NEGATIVE

## 2020-04-24 SURGERY — LEFT HEART CATH AND CORONARY ANGIOGRAPHY
Anesthesia: LOCAL

## 2020-04-24 MED ORDER — ASPIRIN 81 MG PO CHEW: 81.0000 mg | CHEWABLE_TABLET | ORAL | Status: DC

## 2020-04-24 MED ORDER — HEPARIN SODIUM (PORCINE) 1000 UNIT/ML IJ SOLN
INTRAMUSCULAR | Status: DC | PRN
  Administered 2020-04-24: 3500 [IU] via INTRAVENOUS

## 2020-04-24 MED ORDER — MIDAZOLAM HCL 2 MG/2ML IJ SOLN
INTRAMUSCULAR | Status: DC | PRN
  Administered 2020-04-24 (×2): 1 mg via INTRAVENOUS

## 2020-04-24 MED ORDER — METOPROLOL TARTRATE 12.5 MG HALF TABLET
12.5000 mg | ORAL_TABLET | Freq: Once | ORAL | Status: AC
  Administered 2020-04-25: 12.5 mg via ORAL
  Filled 2020-04-24: qty 1

## 2020-04-24 MED ORDER — SODIUM CHLORIDE 0.9% FLUSH: 3.0000 mL | INTRAVENOUS | Status: DC | PRN

## 2020-04-24 MED ORDER — PANTOPRAZOLE SODIUM 20 MG PO TBEC: 20.0000 mg | DELAYED_RELEASE_TABLET | Freq: Every day | ORAL | Status: DC

## 2020-04-24 MED ORDER — INSULIN ASPART 100 UNIT/ML ~~LOC~~ SOLN: 0.0000 [IU] | Freq: Every day | SUBCUTANEOUS | Status: DC

## 2020-04-24 MED ORDER — SODIUM CHLORIDE 0.9 % IV SOLN
1.5000 g | INTRAVENOUS | Status: DC
  Filled 2020-04-24: qty 1.5

## 2020-04-24 MED ORDER — OXYCODONE HCL 5 MG PO TABS: 5.0000 mg | ORAL_TABLET | ORAL | Status: DC | PRN

## 2020-04-24 MED ORDER — SODIUM CHLORIDE 0.9 % IV SOLN
INTRAVENOUS | Status: DC
  Filled 2020-04-24: qty 30

## 2020-04-24 MED ORDER — HEPARIN SODIUM (PORCINE) 1000 UNIT/ML IJ SOLN
INTRAMUSCULAR | Status: AC
  Filled 2020-04-24: qty 1

## 2020-04-24 MED ORDER — INSULIN ASPART 100 UNIT/ML ~~LOC~~ SOLN
0.0000 [IU] | Freq: Three times a day (TID) | SUBCUTANEOUS | Status: DC
  Administered 2020-04-24: 3 [IU] via SUBCUTANEOUS

## 2020-04-24 MED ORDER — FENTANYL CITRATE (PF) 100 MCG/2ML IJ SOLN
INTRAMUSCULAR | Status: DC | PRN
Start: 2020-04-24 — End: 2020-04-24
  Administered 2020-04-24: 50 ug via INTRAVENOUS

## 2020-04-24 MED ORDER — CHLORHEXIDINE GLUCONATE 4 % EX LIQD
60.0000 mL | Freq: Once | CUTANEOUS | Status: AC
  Administered 2020-04-24: 4 via TOPICAL
  Filled 2020-04-24: qty 60

## 2020-04-24 MED ORDER — EPINEPHRINE HCL 5 MG/250ML IV SOLN IN NS
0.0000 ug/min | INTRAVENOUS | Status: DC
  Filled 2020-04-24: qty 250

## 2020-04-24 MED ORDER — NITROGLYCERIN 0.4 MG SL SUBL: 0.4000 mg | SUBLINGUAL_TABLET | SUBLINGUAL | Status: DC | PRN

## 2020-04-24 MED ORDER — TRANEXAMIC ACID (OHS) BOLUS VIA INFUSION
15.0000 mg/kg | INTRAVENOUS | Status: DC
  Filled 2020-04-24: qty 1062

## 2020-04-24 MED ORDER — CHLORHEXIDINE GLUCONATE 4 % EX LIQD
60.0000 mL | Freq: Once | CUTANEOUS | Status: AC
  Administered 2020-04-25: 4 via TOPICAL

## 2020-04-24 MED ORDER — ATORVASTATIN CALCIUM 80 MG PO TABS
80.0000 mg | ORAL_TABLET | Freq: Every day | ORAL | Status: DC
  Administered 2020-04-24 – 2020-05-01 (×7): 80 mg via ORAL
  Filled 2020-04-24 (×7): qty 1

## 2020-04-24 MED ORDER — INSULIN REGULAR(HUMAN) IN NACL 100-0.9 UT/100ML-% IV SOLN
INTRAVENOUS | Status: DC
  Filled 2020-04-24: qty 100

## 2020-04-24 MED ORDER — NITROGLYCERIN IN D5W 200-5 MCG/ML-% IV SOLN: 2.0000 ug/min | INTRAVENOUS | Status: DC

## 2020-04-24 MED ORDER — LISINOPRIL-HYDROCHLOROTHIAZIDE 20-25 MG PO TABS: 1.0000 | ORAL_TABLET | Freq: Every day | ORAL | Status: DC

## 2020-04-24 MED ORDER — INSULIN REGULAR(HUMAN) IN NACL 100-0.9 UT/100ML-% IV SOLN
INTRAVENOUS | Status: AC
  Administered 2020-04-25: 1 [IU]/h via INTRAVENOUS
  Filled 2020-04-24: qty 100

## 2020-04-24 MED ORDER — ALPRAZOLAM 0.25 MG PO TABS: 0.2500 mg | ORAL_TABLET | ORAL | Status: DC | PRN

## 2020-04-24 MED ORDER — HEPARIN (PORCINE) IN NACL 1000-0.9 UT/500ML-% IV SOLN
INTRAVENOUS | Status: DC | PRN
  Administered 2020-04-24 (×2): 500 mL

## 2020-04-24 MED ORDER — NITROGLYCERIN IN D5W 200-5 MCG/ML-% IV SOLN
2.0000 ug/min | INTRAVENOUS | Status: DC
  Filled 2020-04-24: qty 250

## 2020-04-24 MED ORDER — VERAPAMIL HCL 2.5 MG/ML IV SOLN
INTRAVENOUS | Status: DC | PRN
  Administered 2020-04-24: 10 mL via INTRA_ARTERIAL

## 2020-04-24 MED ORDER — HYDROCHLOROTHIAZIDE 25 MG PO TABS: 25.0000 mg | ORAL_TABLET | Freq: Every day | ORAL | Status: DC

## 2020-04-24 MED ORDER — SODIUM CHLORIDE 0.9% FLUSH: 3.0000 mL | Freq: Two times a day (BID) | INTRAVENOUS | Status: DC

## 2020-04-24 MED ORDER — SODIUM CHLORIDE 0.9 % WEIGHT BASED INFUSION
3.0000 mL/kg/h | INTRAVENOUS | Status: DC
  Administered 2020-04-24: 3 mL/kg/h via INTRAVENOUS

## 2020-04-24 MED ORDER — POTASSIUM CHLORIDE 2 MEQ/ML IV SOLN
80.0000 meq | INTRAVENOUS | Status: DC
  Filled 2020-04-24: qty 40

## 2020-04-24 MED ORDER — MAGNESIUM SULFATE 50 % IJ SOLN
40.0000 meq | INTRAMUSCULAR | Status: DC
  Filled 2020-04-24: qty 9.85

## 2020-04-24 MED ORDER — AMLODIPINE BESYLATE 10 MG PO TABS: 10.0000 mg | ORAL_TABLET | Freq: Every day | ORAL | Status: DC

## 2020-04-24 MED ORDER — FENTANYL CITRATE (PF) 100 MCG/2ML IJ SOLN
INTRAMUSCULAR | Status: AC
  Filled 2020-04-24: qty 2

## 2020-04-24 MED ORDER — LIDOCAINE HCL (PF) 1 % IJ SOLN
INTRAMUSCULAR | Status: AC
  Filled 2020-04-24: qty 30

## 2020-04-24 MED ORDER — NOREPINEPHRINE 4 MG/250ML-% IV SOLN
0.0000 ug/min | INTRAVENOUS | Status: DC
  Filled 2020-04-24: qty 250

## 2020-04-24 MED ORDER — MILRINONE LACTATE IN DEXTROSE 20-5 MG/100ML-% IV SOLN
0.3000 ug/kg/min | INTRAVENOUS | Status: AC
  Administered 2020-04-25: .125 ug/kg/min via INTRAVENOUS

## 2020-04-24 MED ORDER — MIDAZOLAM HCL 2 MG/2ML IJ SOLN
INTRAMUSCULAR | Status: AC
  Filled 2020-04-24: qty 2

## 2020-04-24 MED ORDER — LIDOCAINE HCL (PF) 1 % IJ SOLN
INTRAMUSCULAR | Status: DC | PRN
  Administered 2020-04-24: 5 mL

## 2020-04-24 MED ORDER — DEXMEDETOMIDINE HCL IN NACL 400 MCG/100ML IV SOLN
0.1000 ug/kg/h | INTRAVENOUS | Status: AC
  Administered 2020-04-25: .5 ug/kg/h via INTRAVENOUS
  Filled 2020-04-24: qty 100

## 2020-04-24 MED ORDER — PLASMA-LYTE 148 IV SOLN
INTRAVENOUS | Status: DC
  Filled 2020-04-24: qty 2.5

## 2020-04-24 MED ORDER — MILRINONE LACTATE IN DEXTROSE 20-5 MG/100ML-% IV SOLN
0.3000 ug/kg/min | INTRAVENOUS | Status: DC
  Filled 2020-04-24: qty 100

## 2020-04-24 MED ORDER — TRANEXAMIC ACID 1000 MG/10ML IV SOLN
1.5000 mg/kg/h | INTRAVENOUS | Status: AC
  Administered 2020-04-25: 1.5 mg/kg/h via INTRAVENOUS
  Filled 2020-04-24: qty 25

## 2020-04-24 MED ORDER — PHENYLEPHRINE HCL-NACL 20-0.9 MG/250ML-% IV SOLN
30.0000 ug/min | INTRAVENOUS | Status: AC
  Administered 2020-04-25: 20 ug/min via INTRAVENOUS
  Filled 2020-04-24: qty 250

## 2020-04-24 MED ORDER — SODIUM CHLORIDE 0.9 % IV SOLN: INTRAVENOUS | Status: AC

## 2020-04-24 MED ORDER — ACETAMINOPHEN 325 MG PO TABS: 650.0000 mg | ORAL_TABLET | ORAL | Status: DC | PRN

## 2020-04-24 MED ORDER — DEXMEDETOMIDINE HCL IN NACL 400 MCG/100ML IV SOLN
0.1000 ug/kg/h | INTRAVENOUS | Status: DC
  Filled 2020-04-24: qty 100

## 2020-04-24 MED ORDER — NITROGLYCERIN 0.4 MG SL SUBL: 0.4000 mg | SUBLINGUAL_TABLET | SUBLINGUAL | Status: AC | PRN

## 2020-04-24 MED ORDER — LABETALOL HCL 5 MG/ML IV SOLN: 10.0000 mg | INTRAVENOUS | Status: AC | PRN

## 2020-04-24 MED ORDER — ASPIRIN 81 MG PO CHEW: 81.0000 mg | CHEWABLE_TABLET | Freq: Every day | ORAL | Status: DC

## 2020-04-24 MED ORDER — SODIUM CHLORIDE 0.9 % IV SOLN
1.5000 g | INTRAVENOUS | Status: AC
  Administered 2020-04-25: 1.5 g via INTRAVENOUS
  Filled 2020-04-24: qty 1.5

## 2020-04-24 MED ORDER — ONDANSETRON HCL 4 MG/2ML IJ SOLN: 4.0000 mg | Freq: Four times a day (QID) | INTRAMUSCULAR | Status: DC | PRN

## 2020-04-24 MED ORDER — TRANEXAMIC ACID 1000 MG/10ML IV SOLN
1.5000 mg/kg/h | INTRAVENOUS | Status: DC
  Filled 2020-04-24: qty 25

## 2020-04-24 MED ORDER — LISINOPRIL 20 MG PO TABS: 20.0000 mg | ORAL_TABLET | Freq: Every day | ORAL | Status: DC

## 2020-04-24 MED ORDER — SODIUM CHLORIDE 0.9 % IV SOLN: 250.0000 mL | INTRAVENOUS | Status: DC | PRN

## 2020-04-24 MED ORDER — NITROGLYCERIN IN D5W 200-5 MCG/ML-% IV SOLN
10.0000 ug/min | INTRAVENOUS | Status: DC
  Administered 2020-04-24: 10 ug/min via INTRAVENOUS
  Filled 2020-04-24: qty 250

## 2020-04-24 MED ORDER — INSULIN ASPART 100 UNIT/ML ~~LOC~~ SOLN
SUBCUTANEOUS | Status: AC
  Administered 2020-04-24: 8 [IU] via SUBCUTANEOUS
  Filled 2020-04-24: qty 1

## 2020-04-24 MED ORDER — HYDRALAZINE HCL 20 MG/ML IJ SOLN: 10.0000 mg | INTRAMUSCULAR | Status: AC | PRN

## 2020-04-24 MED ORDER — SODIUM CHLORIDE 0.9 % IV SOLN
750.0000 mg | INTRAVENOUS | Status: DC
  Filled 2020-04-24: qty 750

## 2020-04-24 MED ORDER — CHLORHEXIDINE GLUCONATE 0.12 % MT SOLN
15.0000 mL | Freq: Once | OROMUCOSAL | Status: AC
  Administered 2020-04-25: 15 mL via OROMUCOSAL
  Filled 2020-04-24: qty 15

## 2020-04-24 MED ORDER — TRANEXAMIC ACID (OHS) BOLUS VIA INFUSION
15.0000 mg/kg | INTRAVENOUS | Status: AC
  Administered 2020-04-25: 1048.5 mg via INTRAVENOUS
  Filled 2020-04-24: qty 1049

## 2020-04-24 MED ORDER — TEMAZEPAM 15 MG PO CAPS
15.0000 mg | ORAL_CAPSULE | Freq: Once | ORAL | Status: AC | PRN
  Administered 2020-04-24: 15 mg via ORAL
  Filled 2020-04-24: qty 1

## 2020-04-24 MED ORDER — VANCOMYCIN HCL 1250 MG/250ML IV SOLN
1250.0000 mg | INTRAVENOUS | Status: AC
  Administered 2020-04-25: 1250 mg via INTRAVENOUS
  Filled 2020-04-24: qty 250

## 2020-04-24 MED ORDER — LINAGLIPTIN 5 MG PO TABS: 5.0000 mg | ORAL_TABLET | Freq: Every day | ORAL | Status: DC

## 2020-04-24 MED ORDER — ATENOLOL 50 MG PO TABS
50.0000 mg | ORAL_TABLET | Freq: Two times a day (BID) | ORAL | Status: DC
  Administered 2020-04-24: 50 mg via ORAL
  Filled 2020-04-24 (×3): qty 1

## 2020-04-24 MED ORDER — TRANEXAMIC ACID (OHS) PUMP PRIME SOLUTION
2.0000 mg/kg | INTRAVENOUS | Status: DC
  Filled 2020-04-24: qty 1.4

## 2020-04-24 MED ORDER — HEPARIN (PORCINE) 25000 UT/250ML-% IV SOLN
1100.0000 [IU]/h | INTRAVENOUS | Status: DC
  Administered 2020-04-24: 900 [IU]/h via INTRAVENOUS
  Filled 2020-04-24: qty 250

## 2020-04-24 MED ORDER — EPINEPHRINE HCL 5 MG/250ML IV SOLN IN NS
0.0000 ug/min | INTRAVENOUS | Status: AC
  Administered 2020-04-25: 12:00:00 2 ug/min via INTRAVENOUS
  Filled 2020-04-24: qty 250

## 2020-04-24 MED ORDER — ASPIRIN 81 MG PO TABS: 81.0000 mg | ORAL_TABLET | Freq: Every day | ORAL | Status: DC

## 2020-04-24 MED ORDER — VERAPAMIL HCL 2.5 MG/ML IV SOLN
INTRAVENOUS | Status: AC
  Filled 2020-04-24: qty 2

## 2020-04-24 MED ORDER — SODIUM CHLORIDE 0.9 % IV SOLN
750.0000 mg | INTRAVENOUS | Status: AC
  Administered 2020-04-25: 750 mg via INTRAVENOUS
  Filled 2020-04-24: qty 750

## 2020-04-24 MED ORDER — VANCOMYCIN HCL 1250 MG/250ML IV SOLN
1250.0000 mg | INTRAVENOUS | Status: DC
  Filled 2020-04-24: qty 250

## 2020-04-24 MED ORDER — IOHEXOL 350 MG/ML SOLN
INTRAVENOUS | Status: DC | PRN
  Administered 2020-04-24: 60 mL

## 2020-04-24 MED ORDER — HEPARIN (PORCINE) IN NACL 1000-0.9 UT/500ML-% IV SOLN
INTRAVENOUS | Status: AC
  Filled 2020-04-24: qty 1000

## 2020-04-24 MED ORDER — PHENYLEPHRINE HCL-NACL 20-0.9 MG/250ML-% IV SOLN
30.0000 ug/min | INTRAVENOUS | Status: DC
  Filled 2020-04-24: qty 250

## 2020-04-24 MED ORDER — VITAMIN B-12 1000 MCG PO TABS: 1000.0000 ug | ORAL_TABLET | ORAL | Status: DC

## 2020-04-24 MED ORDER — ATORVASTATIN CALCIUM 80 MG PO TABS: 80.0000 mg | ORAL_TABLET | Freq: Every day | ORAL | 11 refills | Status: AC

## 2020-04-24 MED ORDER — TRANEXAMIC ACID (OHS) PUMP PRIME SOLUTION
2.0000 mg/kg | INTRAVENOUS | Status: DC
  Filled 2020-04-24: qty 1.42

## 2020-04-24 MED ORDER — SODIUM CHLORIDE 0.9 % WEIGHT BASED INFUSION: 1.0000 mL/kg/h | INTRAVENOUS | Status: DC

## 2020-04-24 SURGICAL SUPPLY — 10 items
CATH 5FR JL3.5 JR4 ANG PIG MP (CATHETERS) ×2 IMPLANT
DEVICE RAD COMP TR BAND LRG (VASCULAR PRODUCTS) ×2 IMPLANT
GLIDESHEATH SLEND A-KIT 6F 22G (SHEATH) ×2 IMPLANT
GUIDEWIRE INQWIRE 1.5J.035X260 (WIRE) ×1 IMPLANT
INQWIRE 1.5J .035X260CM (WIRE) ×2
KIT HEART LEFT (KITS) ×2 IMPLANT
PACK CARDIAC CATHETERIZATION (CUSTOM PROCEDURE TRAY) ×2 IMPLANT
SHEATH PROBE COVER 6X72 (BAG) ×2 IMPLANT
TRANSDUCER W/STOPCOCK (MISCELLANEOUS) ×2 IMPLANT
TUBING CIL FLEX 10 FLL-RA (TUBING) ×2 IMPLANT

## 2020-04-24 NOTE — Progress Notes (Signed)
Pre- CABG study completed.   See CVProc for preliminary results.   Griffin Basil, RDMS, RVT

## 2020-04-24 NOTE — Consult Note (Addendum)
[address]       [place] [address]             [phone]        Dayshawn C Brahm Lasker Medical Record #[id number] Date of Birth: [birth year]  Referring: Dr. Daneen Schick Primary Care: Kathyrn Drown, MD Primary Cardiologist:Samuel Domenic Polite, MD  Chief Complaint:   Dyspnea on exertion   History of Present Illness:  Mr. Michael Velazquez is a very pleasant 74 year old retired [employer] with a past medical history significant for hypertension, type 2 diabetes mellitus (last A1c 7.4), dyslipidemia, and positive family history for premature heart disease.  He reports that for about 1 year, he has been having intermittent episodes of dyspnea with exertion and occasional "stinging" central chest pain that would resolve with rest.  Easily episodes have not increased in severity in recent months but he has noticed increase in frequency.  He brought this to the attention of his PCP and ultimately of referral was made to cardiology.  He was seen by Dr. Rozann Lesches and cardiac work-up was initiated.  An echocardiogram showed an ejection fraction of 60 to 65% with mild mitral insufficiency.  A Lexiscan nuclear stress test showed evidence of inferior wall ischemia.  The study also incidentally documented atrial fibrillation which was a new diagnosis.  Given these findings, left heart catheterization was recommended.  This study was performed electively earlier today and demonstrates severe two-vessel coronary artery disease.  There is a 50% calcified left main lesion followed by a 95% stenosis in the proximal LAD.  Additionally, there is a proximal 70% and mid to distal 75 to 80% stenosis in the d. the second diagonal has a 70% lesion.  A large circumflex coronary artery had a 95% stenosis in the first obtuse marginal.  The right coronary artery was a large vessel without proximal obstructive disease.  There was a 75% stenosis beyond the origin of the posterior descending  coronary artery.  Estimated ejection fraction was 65% and LVEDP was normal.  Given these findings, cardiology service elected to admit Mr. Michael Velazquez to the hospital for IV heparin and for consideration of urgent coronary bypass grafting. Mr. Stech denies any history of stroke, TIA, vascular disease, or lower extremity varicosities. On a recent dental visit he was told he needed at least 1 filling and 1 crown.   Admission COVID test was negative.   Current Activity/ Functional Status:    Zubrod Score: At the time of surgery this patient's most appropriate activity status/level should be described as: []     0    Normal activity, no symptoms [x]     1    Restricted in physical strenuous activity but ambulatory, able to do out light work []     2    Ambulatory and capable of self care, unable to do work activities, up and about                 more than 50%  Of the time                            []     3    Only limited self care, in bed greater than 50% of waking hours []     4    Completely disabled, no self care, confined to bed or chair []     5    Moribund  Past Medical History:  Diagnosis  Date  . Essential hypertension   . Type 2 diabetes mellitus ([place])     Past Surgical History:  Procedure Laterality Date  . No prior surgery      Social History   Tobacco Use  Smoking Status Never Smoker  Smokeless Tobacco Never Used    Social History   Substance and Sexual Activity  Alcohol Use Yes   Comment: Occasional     Allergies  Allergen Reactions  . [place] Venom [Bee Venom] Hives and Shortness Of Breath    Current Facility-Administered Medications  Medication Dose Route Frequency Provider Last Rate Last Admin  . 0.9 %  sodium chloride infusion  250 mL Intravenous PRN Satira Sark, MD      . 0.9 %  sodium chloride infusion   Intravenous Continuous Belva Crome, MD 100 mL/hr at 04/24/20 0929 Rate Change at 04/24/20 0929  . 0.9 %  sodium chloride infusion  250 mL  Intravenous PRN Belva Crome, MD      . 0.9% sodium chloride infusion  1 mL/kg/hr Intravenous Continuous Satira Sark, MD 70.8 mL/hr at 04/24/20 0811 1 mL/kg/hr at 04/24/20 0811  . acetaminophen (TYLENOL) tablet 650 mg  650 mg Oral Q4H PRN Belva Crome, MD      . Derrill Memo ON 04/25/2020] aspirin chewable tablet 81 mg  81 mg Oral Norm Salt, MD      . Derrill Memo ON 04/25/2020] aspirin chewable tablet 81 mg  81 mg Oral Daily Belva Crome, MD      . hydrALAZINE (APRESOLINE) injection 10 mg  10 mg Intravenous Q20 Min PRN Belva Crome, MD      . labetalol (NORMODYNE) injection 10 mg  10 mg Intravenous Q10 min PRN Belva Crome, MD      . ondansetron [facility]) injection 4 mg  4 mg Intravenous Q6H PRN Belva Crome, MD      . oxyCODONE (Oxy IR/ROXICODONE) immediate release tablet 5-10 mg  5-10 mg Oral Q4H PRN Belva Crome, MD      . sodium chloride flush (NS) 0.9 % injection 3 mL  3 mL Intravenous PRN Satira Sark, MD      . sodium chloride flush (NS) 0.9 % injection 3 mL  3 mL Intravenous Q12H Belva Crome, MD      . sodium chloride flush (NS) 0.9 % injection 3 mL  3 mL Intravenous PRN Belva Crome, MD        Facility-Administered Medications Prior to Admission  Medication Dose Route Frequency Provider Last Rate Last Admin  . [DISCONTINUED] sodium chloride flush (NS) 0.9 % injection 3 mL  3 mL Intravenous Q12H Satira Sark, MD       Medications Prior to Admission  Medication Sig Dispense Refill Last Dose  . amLODipine (NORVASC) 10 MG tablet TAKE 1 TABLET BY MOUTH EVERY DAY (Patient taking differently: Take 10 mg by mouth daily. ) 90 tablet 0 04/24/2020 at 0500  . ascorbic acid (VITAMIN C) 500 MG tablet Take 500 mg by mouth every other day.   04/23/2020 at Unknown time  . aspirin 81 MG tablet Take 81 mg by mouth daily.   04/24/2020 at 0500  . atenolol (TENORMIN) 50 MG tablet TAKE 1 TABLET BY MOUTH TWICE DAILY (Patient taking differently: Take 50 mg by mouth 2  (two) times daily. ) 180 tablet 0 04/24/2020 at 0500  . atorvastatin (LIPITOR) 10 MG tablet TAKE 1 TABLET BY MOUTH DAILY (  Patient taking differently: Take 10 mg by mouth daily. ) 90 tablet 0 Past Week at Unknown time  . cholecalciferol (VITAMIN D3) 25 MCG (1000 UNIT) tablet Take 1,000 Units by mouth every other day.   Past Week at Unknown time  . Emollient (DERMEND BRUISE FORMULA EX) Apply 1 application topically every other day.   04/23/2020 at Unknown time  . EPINEPHrine 0.3 mg/0.3 mL IJ SOAJ injection Inject 0.3 mg into the muscle as needed for anaphylaxis.      Marland Kitchen lansoprazole (PREVACID) 15 MG capsule Take 15 mg by mouth daily as needed (heartburn).   04/23/2020 at Unknown time  . lisinopril-hydrochlorothiazide (ZESTORETIC) 20-25 MG tablet TAKE 1 TABLET BY MOUTH EVERY DAY (Patient taking differently: Take 1 tablet by mouth daily. ) 90 tablet 0 04/23/2020 at Unknown time  . metFORMIN (GLUCOPHAGE) 1000 MG tablet TAKE 1 TABLET BY MOUTH EVERY DAY (Patient taking differently: Take 1,000 mg by mouth daily. ) 90 tablet 0 04/23/2020 at Unknown time  . sitaGLIPtin (JANUVIA) 100 MG tablet Take 1 tablet (100 mg total) by mouth daily. 30 tablet 1 Past Week at Unknown time  . vitamin B-12 (CYANOCOBALAMIN) 1000 MCG tablet Take 1,000 mcg by mouth every other day.   04/23/2020 at Unknown time  . [DISCONTINUED] Ibuprofen-diphenhydrAMINE HCl (IBUPROFEN PM) 200-25 MG CAPS Take 1 tablet by mouth at bedtime as needed (sleep).   Past Week at Unknown time  . [DISCONTINUED] naproxen sodium (ALEVE) 220 MG tablet Take 220-440 mg by mouth daily as needed (pain).   Past Week at Unknown time  . [DISCONTINUED] sildenafil (VIAGRA) 100 MG tablet Take 1/2 -1 tablet po prn (Patient not taking: Reported on 04/22/2020) 3 tablet 0 Not Taking at Unknown time    Family History  Problem Relation Age of Onset  . Hypertension Mother   . Hyperlipidemia Mother   . Heart attack Father        CABG in his 43s     Review of Systems:    ROS     Cardiac Review of Systems: Y or  [    ]= no  Chest Pain [ x   ]  Resting SOB [   ] Exertional SOB  [ x ]  Orthopnea [  ]   Pedal Edema [   ]    Palpitations [  ] Syncope  [  ]   Presyncope [   ]  General Review of Systems: [Y] = yes [  ]=no Constitional: recent weight change [  ]; anorexia [  ]; fatigue [  ]; nausea [  ]; night sweats [  ]; fever [  ]; or chills [  ]                                                               Dental: Last Dentist visit: past 6 months, needs at least 1 filling and 1 crown.    Eye : blurred vision [  ]; diplopia [   ]; vision changes [  ];  Amaurosis fugax[  ]; Resp: cough [  ];  wheezing[  ];  hemoptysis[  ]; shortness of breath[  ]; paroxysmal nocturnal dyspnea[  ]; dyspnea on exertion[  ]; or orthopnea[  ];  GI:  gallstones[  ], vomiting[  ];  dysphagia[  ]; melena[  ];  hematochezia [  ]; heartburn[  ];   Hx of  Colonoscopy[  ]; GU: kidney stones [  ]; hematuria[  ];   dysuria [  ];  nocturia[  ];  history of     obstruction [  ]; urinary frequency [  ]             Skin: rash, swelling[  ];, hair loss[  ];  peripheral edema[  ];  or itching[  ]; Musculosketetal: myalgias[  ];  joint swelling[  ];  joint erythema[  ];  joint pain[  ];  back pain[  ];  Heme/Lymph: bruising[  ];  bleeding[  ];  anemia[  ];  Neuro: TIA[  ];  headaches[  ];  stroke[  ];  vertigo[  ];  seizures[  ];   paresthesias[  ];  difficulty walking[  ];  Psych:depression[  ]; anxiety[  ];  Endocrine: diabetes[ x ];  thyroid dysfunction[  ];               Physical Exam: BP (!) 122/56   Pulse (!) 59   Temp 98.9 F (37.2 C)   Resp 17   Ht 5\' 6"  (1.676 m)   Wt 70.8 kg   SpO2 99%   BMI 25.18 kg/m    General appearance: alert, cooperative and no distress Head: Normocephalic, without obvious abnormality, atraumatic Neck: no adenopathy, no carotid bruit, no JVD, supple, symmetrical, trachea midline and thyroid not enlarged, symmetric, no tenderness/mass/nodules Lymph  nodes: Cervical, supraclavicular, and axillary nodes normal. Resp: clear to auscultation bilaterally Cardio: irregularly irregular rhythm,  Rate ~105/min GI: soft, non-tender; bowel sounds normal; no masses,  no organomegaly Extremities: extremities normal, atraumatic, no cyanosis or edema.  There is a TR band in place over the right radial artery.  Peripheral pulses are otherwise intact. Neurologic: Alert and oriented X 3, normal strength and tone. Normal symmetric reflexes.   Diagnostic Studies & Laboratory data:  NM Myocar Multi W/Spect W/Wall Motion / EF (Accession [id number]) (Order [id number]) Imaging Date: 04/16/2020 Department: [place] Released By: Timoteo Expose Authorizing: Satira Sark, MD  Exam Status  Status  Final [99]  PACS Intelerad Image Link  Show images for NM Myocar Multi W/Spect W/Wall Motion / EF Study Result  Narrative & Impression   Patient noted to be in rate controlled atrial fibrillation throughout study, this appears to be a new diagnosis for him.  There was no ST segment deviation noted during stress.  No T wave inversion was noted during stress.  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  The left ventricular ejection fraction is normal (55-65%).  Low to intermediate risk study     ECHOCARDIOGRAM REPORT       Patient Name:  Michael Velazquez Date of Exam: 04/16/2020  Medical Rec #: [id number]    Height:    66.0 in  Accession #:  [id number]    Weight:    155.0 lb  Date of Birth: [birth year]    BSA:     1.794 m  Patient Age:  28 years     BP:      139/81 mmHg  Patient Gender: M        HR:      72 bpm.  Exam Location: [facility]   Procedure: 2D Echo, Cardiac Doppler and Color Doppler   Indications:  Dyspnea 786.09 / R06.00,  Murmur 785.2 / R01.1    History:    Patient has no prior history  of Echocardiogram  examinations.         Risk Factors:Hypertension, Diabetes and Dyslipidemia.    Sonographer:  Alvino Chapel RCS  Referring Phys: [place]    1. Left ventricular ejection fraction, by estimation, is 60 to 65%. The  left ventricle has normal function. The left ventricle has no regional  wall motion abnormalities. There is mild left ventricular hypertrophy.  Left ventricular diastolic parameters  are indeterminate.  2. Right ventricular systolic function is normal. The right ventricular  size is normal.  3. Left atrial size was mildly dilated.  4. The mitral valve is normal in structure. Mild mitral valve  regurgitation. No evidence of mitral stenosis.  5. The aortic valve is tricuspid. Aortic valve regurgitation is not  visualized. No aortic stenosis is present.  6. The inferior vena cava is normal in size with greater than 50%  respiratory variability, suggesting right atrial pressure of 3 mmHg.   FINDINGS  Left Ventricle: Left ventricular ejection fraction, by estimation, is 60  to 65%. The left ventricle has normal function. The left ventricle has no  regional wall motion abnormalities. The left ventricular internal cavity  size was normal in size. There is  mild left ventricular hypertrophy. Left ventricular diastolic parameters  are indeterminate.   Right Ventricle: The right ventricular size is normal. No increase in  right ventricular wall thickness. Right ventricular systolic function is  normal.   Left Atrium: Left atrial size was mildly dilated.   Right Atrium: Right atrial size was normal in size.   Pericardium: There is no evidence of pericardial effusion.   Mitral Valve: The mitral valve is normal in structure. Mild mitral valve  regurgitation. No evidence of mitral valve stenosis.   Tricuspid Valve: The tricuspid valve is normal in structure. Tricuspid  valve regurgitation is not demonstrated.  No evidence of tricuspid  stenosis.   Aortic Valve: The aortic valve is tricuspid. Aortic valve regurgitation is  not visualized. No aortic stenosis is present. Aortic valve mean gradient  measures 4.3 mmHg. Aortic valve peak gradient measures 7.5 mmHg. Aortic  valve area, by VTI measures 2.16  cm.   Pulmonic Valve: The pulmonic valve was not well visualized. Pulmonic valve  regurgitation is not visualized. No evidence of pulmonic stenosis.   Aorta: The aortic root is normal in size and structure.   Venous: The inferior vena cava is normal in size with greater than 50%  respiratory variability, suggesting right atrial pressure of 3 mmHg.   IAS/Shunts: No atrial level shunt detected by color flow Doppler.     LEFT VENTRICLE  PLAX 2D  LVIDd:     4.33 cm  LVIDs:     2.82 cm  LV PW:     1.15 cm  LV IVS:    1.17 cm  LVOT diam:   1.90 cm  LV SV:     64  LV SV Index:  36  LVOT Area:   2.84 cm     RIGHT VENTRICLE  TAPSE (M-mode): 2.0 cm   LEFT ATRIUM       Index    RIGHT ATRIUM      Index  LA diam:    2.90 cm 1.62 cm/m RA Area:   17.10 cm  LA Vol (A2C):  93.6 ml 52.16 ml/m RA Volume:  46.70 ml 26.02 ml/m  LA Vol (A4C):  39.0 ml 21.73 ml/m  LA Biplane Vol: 67.0 ml 37.34 ml/m  AORTIC VALVE  AV Area (Vmax):  2.18 cm  AV Area (Vmean):  2.04 cm  AV Area (VTI):   2.16 cm  AV Vmax:      137.26 cm/s  AV Vmean:     96.959 cm/s  AV VTI:      0.297 m  AV Peak Grad:   7.5 mmHg  AV Mean Grad:   4.3 mmHg  LVOT Vmax:     105.60 cm/s  LVOT Vmean:    69.600 cm/s  LVOT VTI:     0.226 m  LVOT/AV VTI ratio: 0.76    AORTA  Ao Root diam: 3.00 cm   MITRAL VALVE  MV Area (PHT): 3.33 cm   SHUNTS  MV Decel Time: 228 msec   Systemic VTI: 0.23 m  MV E velocity: 134.00 cm/s Systemic Diam: 1.90 cm   Carlyle Dolly MD  Electronically signed by Carlyle Dolly MD  Signature  Date/Time: 04/16/2020/3:30:56 PM         LEFT HEART CATH AND CORONARY ANGIOGRAPHY  Conclusion    Conclusion Severe two-vessel coronary disease including 50% calcified distal left main. 95 to 99% ostial LAD, proximal 70%, mid to distal 75 to 80%. Second diagonal 70%. First obtuse marginal, very large, and contains 95% mid vessel stenosis. Continuation of circumflex beyond the first marginal is 70% obstructed. It is followed by a moderate sized second obtuse marginal. The right coronary is large vessel without high-grade obstruction in the proximal and mid vessel. There is eccentric 75% stenosis beyond the origin of the PDA supplying small left ventricular branches. Normal LV function. Normal LVEDP. Estimated ejection fraction 65%. RECOMMENDATIONS: Increase statin therapy intensity to 80 mg/day. Discontinue sildenafil. Discontinue nonsteroidal anti-inflammatory agents. T CTS consultation ASAP as an outpatient. If chest pain is not relieved by sublingual nitroglycerin, go to the emergency room. If chest pain occurs at rest, go to the emergency room.    Severe two-vessel coronary disease including 50% calcified distal left main.  95 to 99% ostial LAD, proximal 70%, mid to distal 75 to 80%.  Second diagonal 70%.  First obtuse marginal, very large, and contains 95% mid vessel stenosis.  Continuation of circumflex beyond the first marginal is 70% obstructed.  It is followed by a moderate sized second obtuse marginal.  The right coronary is large vessel without high-grade obstruction in the proximal and mid vessel.  There is eccentric 75% stenosis beyond the origin of the PDA supplying small left ventricular branches.  Normal LV function.  Normal LVEDP.  Estimated ejection fraction 65%.  RECOMMENDATIONS:   Increase statin therapy intensity to 80 mg/day.  Discontinue sildenafil.  Discontinue nonsteroidal anti-inflammatory agents.  T CTS consultation ASAP as an outpatient.  If chest pain is  not relieved by sublingual nitroglycerin, go to the emergency room.  If chest pain occurs at rest, go to the emergency room.  Recommendations  Antiplatelet/Anticoag Recommend Aspirin 81mg  daily for moderate CAD.  Surgeon Notes    04/24/2020 9:57 AM CV Procedure signed by Belva Crome, MD  Indications  Coronary artery disease of native artery of native heart with stable angina pectoris ([place]) [I25.118 (ICD-10-CM)]  Abnormal nuclear cardiac imaging test [R93.1 (ICD-10-CM)]  Procedural Details  Technical Details The right radial area was sterilely prepped and draped. Intravenous sedation with Versed and fentanyl was administered. 1% Xylocaine was infiltrated to achieve local analgesia. Using real-time vascular ultrasound, a double wall stick with an  angiocath was utilized to obtain intra-arterial access. A VUS image was saved for the record.The modified Seldinger technique was used to place a 69F " Slender" sheath in the right radial artery. Weight based heparin was administered. Coronary angiography was done using 5 F catheters. Right coronary angiography was performed with a JR4. Left ventricular hemodymic recordings and angiography was done using the JR 4 catheter and hand injection. Left coronary angiography was performed with a JL 3.5 cm.  Hemostasis was achieved using a pneumatic band.  During this procedure the patient is administered a total of Versed 2 mg and Fentanyl 50 mcg to achieve and maintain moderate conscious sedation.  The patient's heart rate, blood pressure, and oxygen saturation are monitored continuously during the procedure. The period of conscious sedation is 36 minutes, of which I was present face-to-face 100% of this time. Estimated blood loss <50 mL.   During this procedure medications were administered to achieve and maintain moderate conscious sedation while the patient's heart rate, blood pressure, and oxygen saturation were continuously monitored and I was present  face-to-face 100% of this time.  Medications (Filter: Administrations occurring from 0813 to 0920 on 04/24/20) (important) Continuous medications are totaled by the amount administered until 04/24/20 0920.  fentaNYL (SUBLIMAZE) injection (mcg) Total dose:  50 mcg  Date/Time  Rate/Dose/Volume Action  04/24/20 0830  50 mcg Given    midazolam (VERSED) injection (mg) Total dose:  2 mg  Date/Time  Rate/Dose/Volume Action  04/24/20 0830  1 mg Given  0836  1 mg Given    Heparin (Porcine) in NaCl 1000-0.9 UT/500ML-% SOLN (mL) Total volume:  1,000 mL  Date/Time  Rate/Dose/Volume Action  04/24/20 0837  500 mL Given  0837  500 mL Given    lidocaine (PF) (XYLOCAINE) 1 % injection (mL) Total volume:  5 mL  Date/Time  Rate/Dose/Volume Action  04/24/20 0837  5 mL Given    Radial Cocktail/Verapamil only (mL) Total volume:  10 mL  Date/Time  Rate/Dose/Volume Action  04/24/20 0847  10 mL Given    heparin sodium (porcine) injection (Units) Total dose:  3,500 Units  Date/Time  Rate/Dose/Volume Action  04/24/20 0850  3,500 Units Given    iohexol (OMNIPAQUE) 350 MG/ML injection (mL) Total volume:  60 mL  Date/Time  Rate/Dose/Volume Action  04/24/20 0914  60 mL Given    Sedation Time  Sedation Time Physician-1: 36 minutes 4 seconds  Contrast  Medication Name Total Dose  iohexol (OMNIPAQUE) 350 MG/ML injection 60 mL    Radiation/Fluoro  Fluoro time: 6.4 (min) DAP: 13.4 (Gycm2) Cumulative Air Kerma: 14.6 (mGy)  Coronary Findings  Diagnostic Dominance: Right Left Main  Mid LM to Dist LM lesion is 50% stenosed.  Dist LM to Prox LAD lesion is 95% stenosed.  Left Anterior Descending  Prox LAD to Mid LAD lesion is 65% stenosed.  Mid LAD lesion is 75% stenosed.  Second Diagonal Branch  2nd Diag lesion is 70% stenosed.  Left Circumflex  Prox Cx to Mid Cx lesion is 75% stenosed.  First Obtuse Marginal Branch  1st Mrg lesion is 95% stenosed.  Right Coronary Artery  There  is moderate diffuse disease throughout the vessel.  Right Posterior Atrioventricular Artery  RPAV lesion is 75% stenosed.  First Right Posterolateral Branch  Vessel is small in size.  Intervention  No interventions have been documented. Wall Motion  Resting               Coronary Diagrams  Diagnostic Dominance: Right  Recent Radiology Findings:      I have independently reviewed the above radiologic studies and discussed with the patient   Recent Lab Findings: Lab Results  Component Value Date   WBC 10.3 04/22/2020   HGB 12.3 (L) 04/22/2020   HCT 35.6 (L) 04/22/2020   PLT 230 04/22/2020   GLUCOSE 235 (H) 04/22/2020   CHOL 110 10/05/2018   TRIG 114 10/05/2018   HDL 37 (L) 10/05/2018   LDLCALC 50 10/05/2018   ALT 19 03/21/2018   AST 17 03/21/2018   NA 134 (L) 04/22/2020   K 4.2 04/22/2020   CL 98 04/22/2020   CREATININE 1.25 (H) 04/22/2020   BUN 23 04/22/2020   CO2 25 04/22/2020   HGBA1C 7.4 (A) 01/15/2020      Assessment / Plan:      -Severe two-vessel coronary artery disease in a very pleasant 74 year old gentleman presenting with symptoms of exertional angina that has been increasing in frequency over the past few months..  LV function is well-preserved.  He has mild MR by echocardiogram.  Coronary bypass grafting is his best option for revascularization.  The procedure and expected perioperative course was briefly discussed with him.  He would like for [place] to proceed with plans for surgery.  He is tentatively scheduled for coronary bypass grafting by Dr. Darcey Nora tomorrow morning.  -Atrial fibrillation-new onset, currently, rate control is adequate.  Will consider placing left atrial appendage clip at the time of surgery.    -Type 2 diabetes mellitus-his last hemoglobin A1c in June of this year was 7.4.  He is currently being managed with Metformin and Januvia.  -Dyslipidemia-on atorvastatin 10 mg p.o. daily  -History of hypertension--currently  controlled on atenolol 50 mg p.o. daily and Zestoretic 20-25 daily  -Borderline renal insufficiency-review of lab work documented in epic demonstrates high normal creatinine for the past 2 years.  His most recent GFR was 57.  Plan to recheck his serum creatinine early tomorrow morning to follow up after contrast exposure today.  I  spent 25 minutes counseling the patient face to face.  Antony Odea, PA-C  04/24/2020 11:24 AM  Patient examined, images of coronary arteriogram and echocardiogram personally reviewed.  Chest x-ray is still pending.  74 year old diabetic hypertensive non-smoker with outpatient catheterization today for symptoms of accelerating angina.  He was found to have severe multivessel coronary disease.  Probably developed atrial fibrillation earlier this summer by review of old EKGs.  His echo shows no significant valvular disease with preserved LV systolic function.  He was recommended for multivessel coronary bypass grafting by his cardiologist.       Exam    General- alert and comfortable    Neck- no JVD, no cervical adenopathy palpable, no carotid bruit   Lungs- clear without rales, wheezes   Cor- irregular rate and rhythm, no murmur , gallop   Abdomen- soft, non-tender   Extremities - warm, non-tender, minimal edema   Neuro- oriented, appropriate, no focal weakness  I have discussed procedure of CABG with patient and his wife for treatment of his angina, preservation of function and improve survival.  We discussed the use of general anesthesia and cardiopulmonary bypass, location surgical incisions, and expected postoperative hospital recovery.  We have also discussed the risks of stroke, bleeding, transfusion requirement, infection, organ failure, MI and death.  Also discussed the procedure of atrial ablations-Maze procedure for treatment of his probable recent onset atrial fibrillation.  And risk of persistent atrial fibrillation pacemaker department. We will  proceed with CABG-maze in a.m.  Dahlia Byes MD

## 2020-04-24 NOTE — CV Procedure (Signed)
FINDINGS:  After speaking with the patient's wife, finally he is having symptoms at rest and with minimal activity.  I decided to keep the patient in-house to hopefully expedite coronary bypass surgery.  Complex calcified distal left main/ostial LAD, high-grade first obtuse marginal and mid circumflex.  Normal function  T CTS has been consulted.  Admit to hospital.  IV NTG, IV heparin.

## 2020-04-24 NOTE — Progress Notes (Signed)
1320-1400 Gave pt OHS booklet, care guide and moving in the tube handout. Discussed IS and mobility for pulmonary rehab. Discussed sternal precautions and restrictions. Wrote down how to view pre op video. Pt stated wife will be able to help with care after discharge. Emotional support given. Will follow up after surgery. Pt voiced understanding of ed. Graylon Good RN BSN 04/24/2020 2:01 PM

## 2020-04-24 NOTE — Progress Notes (Signed)
ANTICOAGULATION CONSULT NOTE - Initial Consult  Pharmacy Consult for heparin  Indication: chest pain/ACS  Allergies  Allergen Reactions  . Yellow Jacket Venom [Bee Venom] Hives and Shortness Of Breath    Patient Measurements: Height: 5\' 6"  (167.6 cm) Weight: 70.8 kg (156 lb) IBW/kg (Calculated) : 63.8 Heparin Dosing Weight: 70kg  Vital Signs: Temp: 98.9 F (37.2 C) (09/29 0643) Temp Source: Rectal (09/29 0700) BP: 122/56 (09/29 1100) Pulse Rate: 59 (09/29 1100)  Labs: Recent Labs    04/22/20 1543  HGB 12.3*  HCT 35.6*  PLT 230  CREATININE 1.25*    Estimated Creatinine Clearance: 48.2 mL/min (A) (by C-G formula based on SCr of 1.25 mg/dL (H)).   Medical History: Past Medical History:  Diagnosis Date  . Essential hypertension   . Type 2 diabetes mellitus [facility])     Assessment: 74 year old male s/p cath this morning found to have multivessel CAD. CVTS is being consulted for possible surgery. Patient was not on anticoagulation prior to admit. New orders to start IV heparin after sheath/TR band removed.   Goal of Therapy:  Heparin level 0.3-0.7 units/ml Monitor platelets by anticoagulation protocol: Yes   Plan:  Start heparin infusion at 900 units/hr Check anti-Xa level in 8 hours and daily while on heparin Continue to monitor H&H and platelets  Erin Hearing PharmD., BCPS Clinical Pharmacist 04/24/2020 11:38 AM

## 2020-04-25 ENCOUNTER — Inpatient Hospital Stay (HOSPITAL_COMMUNITY)
Admission: AD | Disposition: A | Discharge status: Home / Self Care | Payer: Self-pay | Source: Home / Self Care | Attending: Cardiothoracic Surgery

## 2020-04-25 ENCOUNTER — Inpatient Hospital Stay (HOSPITAL_COMMUNITY): Payer: Medicare Other

## 2020-04-25 ENCOUNTER — Inpatient Hospital Stay (HOSPITAL_COMMUNITY): Payer: Medicare Other | Admitting: Certified Registered Nurse Anesthetist

## 2020-04-25 ENCOUNTER — Encounter (HOSPITAL_COMMUNITY): Payer: Self-pay | Admitting: Interventional Cardiology

## 2020-04-25 DIAGNOSIS — Z951 Presence of aortocoronary bypass graft: Secondary | ICD-10-CM

## 2020-04-25 DIAGNOSIS — R9439 Abnormal result of other cardiovascular function study: Secondary | ICD-10-CM

## 2020-04-25 DIAGNOSIS — I25110 Atherosclerotic heart disease of native coronary artery with unstable angina pectoris: Secondary | ICD-10-CM

## 2020-04-25 DIAGNOSIS — I480 Paroxysmal atrial fibrillation: Secondary | ICD-10-CM

## 2020-04-25 HISTORY — PX: MAZE: SHX5063

## 2020-04-25 HISTORY — PX: CORONARY ARTERY BYPASS GRAFT: SHX141

## 2020-04-25 HISTORY — PX: TEE WITHOUT CARDIOVERSION: SHX5443

## 2020-04-25 HISTORY — PX: CLIPPING OF ATRIAL APPENDAGE: SHX5773

## 2020-04-25 LAB — CBC
HCT: 36.3 % — ABNORMAL LOW (ref 39.0–52.0)
HCT: 28.3 % — ABNORMAL LOW (ref 39.0–52.0)
HCT: 29.7 % — ABNORMAL LOW (ref 39.0–52.0)
Hemoglobin: 12.4 g/dL — ABNORMAL LOW (ref 13.0–17.0)
Hemoglobin: 9.7 g/dL — ABNORMAL LOW (ref 13.0–17.0)
Hemoglobin: 10.3 g/dL — ABNORMAL LOW (ref 13.0–17.0)
MCH: 29.9 pg (ref 26.0–34.0)
MCH: 30.1 pg (ref 26.0–34.0)
MCH: 30.3 pg (ref 26.0–34.0)
MCHC: 34.2 g/dL (ref 30.0–36.0)
MCHC: 34.3 g/dL (ref 30.0–36.0)
MCHC: 34.7 g/dL (ref 30.0–36.0)
MCV: 87.5 fL (ref 80.0–100.0)
MCV: 87.9 fL (ref 80.0–100.0)
MCV: 87.4 fL (ref 80.0–100.0)
Platelets: 219 10*3/uL (ref 150–400)
Platelets: 97 10*3/uL — ABNORMAL LOW (ref 150–400)
Platelets: 114 10*3/uL — ABNORMAL LOW (ref 150–400)
RBC: 4.15 MIL/uL — ABNORMAL LOW (ref 4.22–5.81)
RBC: 3.22 MIL/uL — ABNORMAL LOW (ref 4.22–5.81)
RBC: 3.4 MIL/uL — ABNORMAL LOW (ref 4.22–5.81)
RDW: 12.6 % (ref 11.5–15.5)
RDW: 12.9 % (ref 11.5–15.5)
RDW: 13 % (ref 11.5–15.5)
WBC: 10.9 10*3/uL — ABNORMAL HIGH (ref 4.0–10.5)
WBC: 13.1 10*3/uL — ABNORMAL HIGH (ref 4.0–10.5)
WBC: 13.7 10*3/uL — ABNORMAL HIGH (ref 4.0–10.5)
nRBC: 0 % (ref 0.0–0.2)
nRBC: 0 % (ref 0.0–0.2)
nRBC: 0 % (ref 0.0–0.2)

## 2020-04-25 LAB — POCT I-STAT 7, (LYTES, BLD GAS, ICA,H+H)
Acid-Base Excess: 1 mmol/L (ref 0.0–2.0)
Acid-Base Excess: 1 mmol/L (ref 0.0–2.0)
Acid-Base Excess: 0 mmol/L (ref 0.0–2.0)
Acid-Base Excess: 0 mmol/L (ref 0.0–2.0)
Acid-base deficit: 1 mmol/L (ref 0.0–2.0)
Acid-base deficit: 1 mmol/L (ref 0.0–2.0)
Acid-base deficit: 1 mmol/L (ref 0.0–2.0)
Bicarbonate: 26.4 mmol/L (ref 20.0–28.0)
Bicarbonate: 25.9 mmol/L (ref 20.0–28.0)
Bicarbonate: 25.2 mmol/L (ref 20.0–28.0)
Bicarbonate: 24.2 mmol/L (ref 20.0–28.0)
Bicarbonate: 24.8 mmol/L (ref 20.0–28.0)
Bicarbonate: 25.1 mmol/L (ref 20.0–28.0)
Bicarbonate: 23.7 mmol/L (ref 20.0–28.0)
Calcium, Ion: 1.06 mmol/L — ABNORMAL LOW (ref 1.15–1.40)
Calcium, Ion: 1.09 mmol/L — ABNORMAL LOW (ref 1.15–1.40)
Calcium, Ion: 1.03 mmol/L — ABNORMAL LOW (ref 1.15–1.40)
Calcium, Ion: 1.24 mmol/L (ref 1.15–1.40)
Calcium, Ion: 1.1 mmol/L — ABNORMAL LOW (ref 1.15–1.40)
Calcium, Ion: 1.19 mmol/L (ref 1.15–1.40)
Calcium, Ion: 1.19 mmol/L (ref 1.15–1.40)
HCT: 23 % — ABNORMAL LOW (ref 39.0–52.0)
HCT: 25 % — ABNORMAL LOW (ref 39.0–52.0)
HCT: 24 % — ABNORMAL LOW (ref 39.0–52.0)
HCT: 21 % — ABNORMAL LOW (ref 39.0–52.0)
HCT: 28 % — ABNORMAL LOW (ref 39.0–52.0)
HCT: 27 % — ABNORMAL LOW (ref 39.0–52.0)
HCT: 30 % — ABNORMAL LOW (ref 39.0–52.0)
Hemoglobin: 7.8 g/dL — ABNORMAL LOW (ref 13.0–17.0)
Hemoglobin: 8.5 g/dL — ABNORMAL LOW (ref 13.0–17.0)
Hemoglobin: 8.2 g/dL — ABNORMAL LOW (ref 13.0–17.0)
Hemoglobin: 7.1 g/dL — ABNORMAL LOW (ref 13.0–17.0)
Hemoglobin: 9.5 g/dL — ABNORMAL LOW (ref 13.0–17.0)
Hemoglobin: 9.2 g/dL — ABNORMAL LOW (ref 13.0–17.0)
Hemoglobin: 10.2 g/dL — ABNORMAL LOW (ref 13.0–17.0)
O2 Saturation: 100 %
O2 Saturation: 100 %
O2 Saturation: 100 %
O2 Saturation: 100 %
O2 Saturation: 99 %
O2 Saturation: 99 %
O2 Saturation: 98 %
Patient temperature: 35.5
Patient temperature: 37.3
Patient temperature: 37.2
Potassium: 3.4 mmol/L — ABNORMAL LOW (ref 3.5–5.1)
Potassium: 3.8 mmol/L (ref 3.5–5.1)
Potassium: 4.8 mmol/L (ref 3.5–5.1)
Potassium: 3.8 mmol/L (ref 3.5–5.1)
Potassium: 4 mmol/L (ref 3.5–5.1)
Potassium: 3.8 mmol/L (ref 3.5–5.1)
Potassium: 3.9 mmol/L (ref 3.5–5.1)
Sodium: 143 mmol/L (ref 135–145)
Sodium: 140 mmol/L (ref 135–145)
Sodium: 140 mmol/L (ref 135–145)
Sodium: 141 mmol/L (ref 135–145)
Sodium: 141 mmol/L (ref 135–145)
Sodium: 139 mmol/L (ref 135–145)
Sodium: 141 mmol/L (ref 135–145)
TCO2: 28 mmol/L (ref 22–32)
TCO2: 27 mmol/L (ref 22–32)
TCO2: 26 mmol/L (ref 22–32)
TCO2: 25 mmol/L (ref 22–32)
TCO2: 26 mmol/L (ref 22–32)
TCO2: 26 mmol/L (ref 22–32)
TCO2: 25 mmol/L (ref 22–32)
pCO2 arterial: 44.9 mmHg (ref 32.0–48.0)
pCO2 arterial: 39.5 mmHg (ref 32.0–48.0)
pCO2 arterial: 39.6 mmHg (ref 32.0–48.0)
pCO2 arterial: 39 mmHg (ref 32.0–48.0)
pCO2 arterial: 45 mmHg (ref 32.0–48.0)
pCO2 arterial: 40.7 mmHg (ref 32.0–48.0)
pCO2 arterial: 36.9 mmHg (ref 32.0–48.0)
pH, Arterial: 7.377 (ref 7.350–7.450)
pH, Arterial: 7.425 (ref 7.350–7.450)
pH, Arterial: 7.411 (ref 7.350–7.450)
pH, Arterial: 7.4 (ref 7.350–7.450)
pH, Arterial: 7.342 — ABNORMAL LOW (ref 7.350–7.450)
pH, Arterial: 7.4 (ref 7.350–7.450)
pH, Arterial: 7.417 (ref 7.350–7.450)
pO2, Arterial: 302 mmHg — ABNORMAL HIGH (ref 83.0–108.0)
pO2, Arterial: 372 mmHg — ABNORMAL HIGH (ref 83.0–108.0)
pO2, Arterial: 283 mmHg — ABNORMAL HIGH (ref 83.0–108.0)
pO2, Arterial: 387 mmHg — ABNORMAL HIGH (ref 83.0–108.0)
pO2, Arterial: 149 mmHg — ABNORMAL HIGH (ref 83.0–108.0)
pO2, Arterial: 126 mmHg — ABNORMAL HIGH (ref 83.0–108.0)
pO2, Arterial: 104 mmHg (ref 83.0–108.0)

## 2020-04-25 LAB — POCT I-STAT, CHEM 8
BUN: 17 mg/dL (ref 8–23)
BUN: 15 mg/dL (ref 8–23)
BUN: 16 mg/dL (ref 8–23)
BUN: 14 mg/dL (ref 8–23)
BUN: 13 mg/dL (ref 8–23)
BUN: 14 mg/dL (ref 8–23)
Calcium, Ion: 1.16 mmol/L (ref 1.15–1.40)
Calcium, Ion: 1.06 mmol/L — ABNORMAL LOW (ref 1.15–1.40)
Calcium, Ion: 1.24 mmol/L (ref 1.15–1.40)
Calcium, Ion: 1.03 mmol/L — ABNORMAL LOW (ref 1.15–1.40)
Calcium, Ion: 1.16 mmol/L (ref 1.15–1.40)
Calcium, Ion: 1.26 mmol/L (ref 1.15–1.40)
Chloride: 99 mmol/L (ref 98–111)
Chloride: 100 mmol/L (ref 98–111)
Chloride: 101 mmol/L (ref 98–111)
Chloride: 100 mmol/L (ref 98–111)
Chloride: 102 mmol/L (ref 98–111)
Chloride: 102 mmol/L (ref 98–111)
Creatinine, Ser: 0.8 mg/dL (ref 0.61–1.24)
Creatinine, Ser: 0.8 mg/dL (ref 0.61–1.24)
Creatinine, Ser: 0.9 mg/dL (ref 0.61–1.24)
Creatinine, Ser: 0.7 mg/dL (ref 0.61–1.24)
Creatinine, Ser: 0.7 mg/dL (ref 0.61–1.24)
Creatinine, Ser: 0.7 mg/dL (ref 0.61–1.24)
Glucose, Bld: 269 mg/dL — ABNORMAL HIGH (ref 70–99)
Glucose, Bld: 175 mg/dL — ABNORMAL HIGH (ref 70–99)
Glucose, Bld: 219 mg/dL — ABNORMAL HIGH (ref 70–99)
Glucose, Bld: 165 mg/dL — ABNORMAL HIGH (ref 70–99)
Glucose, Bld: 151 mg/dL — ABNORMAL HIGH (ref 70–99)
Glucose, Bld: 156 mg/dL — ABNORMAL HIGH (ref 70–99)
HCT: 27 % — ABNORMAL LOW (ref 39.0–52.0)
HCT: 23 % — ABNORMAL LOW (ref 39.0–52.0)
HCT: 26 % — ABNORMAL LOW (ref 39.0–52.0)
HCT: 22 % — ABNORMAL LOW (ref 39.0–52.0)
HCT: 20 % — ABNORMAL LOW (ref 39.0–52.0)
HCT: 24 % — ABNORMAL LOW (ref 39.0–52.0)
Hemoglobin: 9.2 g/dL — ABNORMAL LOW (ref 13.0–17.0)
Hemoglobin: 7.8 g/dL — ABNORMAL LOW (ref 13.0–17.0)
Hemoglobin: 8.8 g/dL — ABNORMAL LOW (ref 13.0–17.0)
Hemoglobin: 7.5 g/dL — ABNORMAL LOW (ref 13.0–17.0)
Hemoglobin: 6.8 g/dL — CL (ref 13.0–17.0)
Hemoglobin: 8.2 g/dL — ABNORMAL LOW (ref 13.0–17.0)
Potassium: 3.4 mmol/L — ABNORMAL LOW (ref 3.5–5.1)
Potassium: 3.4 mmol/L — ABNORMAL LOW (ref 3.5–5.1)
Potassium: 3.7 mmol/L (ref 3.5–5.1)
Potassium: 4.1 mmol/L (ref 3.5–5.1)
Potassium: 3.7 mmol/L (ref 3.5–5.1)
Potassium: 3.8 mmol/L (ref 3.5–5.1)
Sodium: 138 mmol/L (ref 135–145)
Sodium: 137 mmol/L (ref 135–145)
Sodium: 139 mmol/L (ref 135–145)
Sodium: 139 mmol/L (ref 135–145)
Sodium: 140 mmol/L (ref 135–145)
Sodium: 140 mmol/L (ref 135–145)
TCO2: 26 mmol/L (ref 22–32)
TCO2: 24 mmol/L (ref 22–32)
TCO2: 24 mmol/L (ref 22–32)
TCO2: 24 mmol/L (ref 22–32)
TCO2: 24 mmol/L (ref 22–32)
TCO2: 27 mmol/L (ref 22–32)

## 2020-04-25 LAB — ECHO INTRAOPERATIVE TEE
AV Mean grad: 4 mmHg
Height: 66 in
Weight: 2403.2 oz

## 2020-04-25 LAB — BASIC METABOLIC PANEL
Anion gap: 9 (ref 5–15)
BUN: 12 mg/dL (ref 8–23)
CO2: 22 mmol/L (ref 22–32)
Calcium: 7.5 mg/dL — ABNORMAL LOW (ref 8.9–10.3)
Chloride: 109 mmol/L (ref 98–111)
Creatinine, Ser: 0.92 mg/dL (ref 0.61–1.24)
GFR calc Af Amer: >60 mL/min (ref >60)
GFR calc non Af Amer: >60 mL/min (ref >60)
Glucose, Bld: 126 mg/dL — ABNORMAL HIGH (ref 70–99)
Potassium: 3.5 mmol/L (ref 3.5–5.1)
Sodium: 140 mmol/L (ref 135–145)

## 2020-04-25 LAB — HEMOGLOBIN AND HEMATOCRIT, BLOOD
HCT: 22.7 % — ABNORMAL LOW (ref 39.0–52.0)
Hemoglobin: 7.8 g/dL — ABNORMAL LOW (ref 13.0–17.0)

## 2020-04-25 LAB — GLUCOSE, CAPILLARY
Glucose-Capillary: 101 mg/dL — ABNORMAL HIGH (ref 70–99)
Glucose-Capillary: 115 mg/dL — ABNORMAL HIGH (ref 70–99)
Glucose-Capillary: 116 mg/dL — ABNORMAL HIGH (ref 70–99)
Glucose-Capillary: 116 mg/dL — ABNORMAL HIGH (ref 70–99)
Glucose-Capillary: 118 mg/dL — ABNORMAL HIGH (ref 70–99)
Glucose-Capillary: 120 mg/dL — ABNORMAL HIGH (ref 70–99)
Glucose-Capillary: 139 mg/dL — ABNORMAL HIGH (ref 70–99)
Glucose-Capillary: 245 mg/dL — ABNORMAL HIGH (ref 70–99)

## 2020-04-25 LAB — BLOOD GAS, ARTERIAL
Acid-Base Excess: 2.2 mmol/L — ABNORMAL HIGH (ref 0.0–2.0)
Bicarbonate: 26 mmol/L (ref 20.0–28.0)
Drawn by: 36527
FIO2: 21
O2 Saturation: 97.4 %
Patient temperature: 37
pCO2 arterial: 38.8 mmHg (ref 32.0–48.0)
pH, Arterial: 7.442 (ref 7.350–7.450)
pO2, Arterial: 86.8 mmHg (ref 83.0–108.0)

## 2020-04-25 LAB — MAGNESIUM: Magnesium: 2.3 mg/dL (ref 1.7–2.4)

## 2020-04-25 LAB — PREPARE RBC (CROSSMATCH)

## 2020-04-25 LAB — COMPREHENSIVE METABOLIC PANEL
ALT: 20 U/L (ref 0–44)
AST: 20 U/L (ref 15–41)
Albumin: 3.4 g/dL — ABNORMAL LOW (ref 3.5–5.0)
Alkaline Phosphatase: 38 U/L (ref 38–126)
Anion gap: 10 (ref 5–15)
BUN: 17 mg/dL (ref 8–23)
CO2: 27 mmol/L (ref 22–32)
Calcium: 8.9 mg/dL (ref 8.9–10.3)
Chloride: 100 mmol/L (ref 98–111)
Creatinine, Ser: 1.12 mg/dL (ref 0.61–1.24)
GFR calc Af Amer: >60 mL/min (ref >60)
GFR calc non Af Amer: >60 mL/min (ref >60)
Glucose, Bld: 243 mg/dL — ABNORMAL HIGH (ref 70–99)
Potassium: 3.6 mmol/L (ref 3.5–5.1)
Sodium: 137 mmol/L (ref 135–145)
Total Bilirubin: 1.1 mg/dL (ref 0.3–1.2)
Total Protein: 6.2 g/dL — ABNORMAL LOW (ref 6.5–8.1)

## 2020-04-25 LAB — LIPID PANEL
Cholesterol: 143 mg/dL (ref 0–200)
HDL: 30 mg/dL — ABNORMAL LOW (ref >40)
LDL Cholesterol: 77 mg/dL (ref 0–99)
Total CHOL/HDL Ratio: 4.8 RATIO
Triglycerides: 179 mg/dL — ABNORMAL HIGH (ref <150)
VLDL: 36 mg/dL (ref 0–40)

## 2020-04-25 LAB — PROTIME-INR
INR: 1.7 — ABNORMAL HIGH (ref 0.8–1.2)
Prothrombin Time: 19.4 seconds — ABNORMAL HIGH (ref 11.4–15.2)

## 2020-04-25 LAB — HEMOGLOBIN A1C
Hgb A1c MFr Bld: 8.1 % — ABNORMAL HIGH (ref 4.8–5.6)
Mean Plasma Glucose: 185.77 mg/dL

## 2020-04-25 LAB — HEPARIN LEVEL (UNFRACTIONATED): Heparin Unfractionated: 0.2 IU/mL — ABNORMAL LOW (ref 0.30–0.70)

## 2020-04-25 LAB — APTT
aPTT: 71 seconds — ABNORMAL HIGH (ref 24–36)
aPTT: 30 seconds (ref 24–36)

## 2020-04-25 LAB — PLATELET COUNT: Platelets: 115 10*3/uL — ABNORMAL LOW (ref 150–400)

## 2020-04-25 SURGERY — CORONARY ARTERY BYPASS GRAFTING (CABG)
Anesthesia: General | Site: Chest

## 2020-04-25 MED ORDER — LACTATED RINGERS IV SOLN: INTRAVENOUS | Status: DC | PRN

## 2020-04-25 MED ORDER — HEPARIN SODIUM (PORCINE) 1000 UNIT/ML IJ SOLN
INTRAMUSCULAR | Status: DC | PRN
  Administered 2020-04-25: 2000 [IU] via INTRAVENOUS
  Administered 2020-04-25: 20000 [IU] via INTRAVENOUS

## 2020-04-25 MED ORDER — ALBUMIN HUMAN 5 % IV SOLN: INTRAVENOUS | Status: DC | PRN

## 2020-04-25 MED ORDER — FENTANYL CITRATE (PF) 250 MCG/5ML IJ SOLN
INTRAMUSCULAR | Status: AC
  Filled 2020-04-25: qty 25

## 2020-04-25 MED ORDER — TRAMADOL HCL 50 MG PO TABS
50.0000 mg | ORAL_TABLET | ORAL | Status: DC | PRN
  Administered 2020-04-26 – 2020-05-01 (×3): 50 mg via ORAL
  Filled 2020-04-25 (×4): qty 1

## 2020-04-25 MED ORDER — EPHEDRINE SULFATE-NACL 50-0.9 MG/10ML-% IV SOSY
PREFILLED_SYRINGE | INTRAVENOUS | Status: DC | PRN
  Administered 2020-04-25 (×3): 5 mg via INTRAVENOUS

## 2020-04-25 MED ORDER — POTASSIUM CHLORIDE 10 MEQ/50ML IV SOLN: 10.0000 meq | INTRAVENOUS | Status: AC

## 2020-04-25 MED ORDER — VANCOMYCIN HCL IN DEXTROSE 1-5 GM/200ML-% IV SOLN
1000.0000 mg | Freq: Two times a day (BID) | INTRAVENOUS | Status: AC
  Administered 2020-04-25 – 2020-04-26 (×2): 1000 mg via INTRAVENOUS
  Filled 2020-04-25 (×2): qty 200

## 2020-04-25 MED ORDER — ACETAMINOPHEN 160 MG/5ML PO SOLN: 650.0000 mg | Freq: Once | ORAL | Status: AC

## 2020-04-25 MED ORDER — CHLORHEXIDINE GLUCONATE 0.12 % MT SOLN
15.0000 mL | OROMUCOSAL | Status: AC
  Administered 2020-04-25: 15 mL via OROMUCOSAL

## 2020-04-25 MED ORDER — ONDANSETRON HCL 4 MG/2ML IJ SOLN
4.0000 mg | Freq: Four times a day (QID) | INTRAMUSCULAR | Status: DC | PRN
  Filled 2020-04-25: qty 2

## 2020-04-25 MED ORDER — HEMOSTATIC AGENTS (NO CHARGE) OPTIME
TOPICAL | Status: DC | PRN
  Administered 2020-04-25: 1 via TOPICAL

## 2020-04-25 MED ORDER — NITROGLYCERIN 0.2 MG/ML ON CALL CATH LAB
INTRAVENOUS | Status: DC | PRN
  Administered 2020-04-25 (×2): 20 ug via INTRAVENOUS

## 2020-04-25 MED ORDER — EPINEPHRINE HCL 5 MG/250ML IV SOLN IN NS: 0.0000 ug/min | INTRAVENOUS | Status: DC

## 2020-04-25 MED ORDER — ALBUMIN HUMAN 5 % IV SOLN: 250.0000 mL | INTRAVENOUS | Status: AC | PRN

## 2020-04-25 MED ORDER — MIDAZOLAM HCL 2 MG/2ML IJ SOLN: 2.0000 mg | INTRAMUSCULAR | Status: DC | PRN

## 2020-04-25 MED ORDER — METOPROLOL TARTRATE 25 MG/10 ML ORAL SUSPENSION: 12.5000 mg | Freq: Two times a day (BID) | ORAL | Status: DC

## 2020-04-25 MED ORDER — ORAL CARE MOUTH RINSE
15.0000 mL | OROMUCOSAL | Status: DC
  Administered 2020-04-25 – 2020-04-26 (×6): 15 mL via OROMUCOSAL

## 2020-04-25 MED ORDER — SODIUM CHLORIDE 0.9 % IV SOLN
INTRAVENOUS | Status: DC | PRN
  Administered 2020-04-25: 20 ug via INTRAVENOUS

## 2020-04-25 MED ORDER — LACTATED RINGERS IV SOLN: INTRAVENOUS | Status: DC

## 2020-04-25 MED ORDER — PANTOPRAZOLE SODIUM 40 MG PO TBEC
40.0000 mg | DELAYED_RELEASE_TABLET | Freq: Every day | ORAL | Status: DC
  Administered 2020-04-27 – 2020-05-02 (×6): 40 mg via ORAL
  Filled 2020-04-25 (×6): qty 1

## 2020-04-25 MED ORDER — LACTATED RINGERS IV SOLN: 500.0000 mL | Freq: Once | INTRAVENOUS | Status: DC | PRN

## 2020-04-25 MED ORDER — DEXTROSE 50 % IV SOLN: 0.0000 mL | INTRAVENOUS | Status: DC | PRN

## 2020-04-25 MED ORDER — VASOPRESSIN 20 UNIT/ML IV SOLN
INTRAVENOUS | Status: AC
  Filled 2020-04-25: qty 1

## 2020-04-25 MED ORDER — CHLORHEXIDINE GLUCONATE CLOTH 2 % EX PADS
6.0000 | MEDICATED_PAD | Freq: Every day | CUTANEOUS | Status: DC
  Administered 2020-04-26 – 2020-04-27 (×2): 6 via TOPICAL

## 2020-04-25 MED ORDER — PHENYLEPHRINE HCL-NACL 20-0.9 MG/250ML-% IV SOLN: 0.0000 ug/min | INTRAVENOUS | Status: DC

## 2020-04-25 MED ORDER — POTASSIUM CHLORIDE 10 MEQ/50ML IV SOLN
10.0000 meq | INTRAVENOUS | Status: DC
  Administered 2020-04-25 (×2): 10 meq via INTRAVENOUS
  Filled 2020-04-25 (×2): qty 50

## 2020-04-25 MED ORDER — NITROGLYCERIN IN D5W 200-5 MCG/ML-% IV SOLN: 0.0000 ug/min | INTRAVENOUS | Status: DC

## 2020-04-25 MED ORDER — BISACODYL 10 MG RE SUPP: 10.0000 mg | Freq: Every day | RECTAL | Status: DC

## 2020-04-25 MED ORDER — SODIUM CHLORIDE 0.45 % IV SOLN: INTRAVENOUS | Status: DC | PRN

## 2020-04-25 MED ORDER — SODIUM CHLORIDE 0.9% IV SOLUTION: Freq: Once | INTRAVENOUS | Status: DC

## 2020-04-25 MED ORDER — METOPROLOL TARTRATE 12.5 MG HALF TABLET
12.5000 mg | ORAL_TABLET | Freq: Two times a day (BID) | ORAL | Status: DC
  Administered 2020-04-26 – 2020-04-28 (×5): 12.5 mg via ORAL
  Filled 2020-04-25 (×6): qty 1

## 2020-04-25 MED ORDER — ASPIRIN 81 MG PO CHEW: 324.0000 mg | CHEWABLE_TABLET | Freq: Every day | ORAL | Status: DC

## 2020-04-25 MED ORDER — SODIUM CHLORIDE 0.9% FLUSH
3.0000 mL | Freq: Two times a day (BID) | INTRAVENOUS | Status: DC
  Administered 2020-04-26 (×2): 3 mL via INTRAVENOUS
  Administered 2020-04-27: 6 mL via INTRAVENOUS
  Administered 2020-04-27 – 2020-04-28 (×2): 3 mL via INTRAVENOUS

## 2020-04-25 MED ORDER — MILRINONE LACTATE IN DEXTROSE 20-5 MG/100ML-% IV SOLN: 0.1250 ug/kg/min | INTRAVENOUS | Status: DC

## 2020-04-25 MED ORDER — AMIODARONE HCL IN DEXTROSE 360-4.14 MG/200ML-% IV SOLN
INTRAVENOUS | Status: DC | PRN
  Administered 2020-04-25: 60 mg/h via INTRAVENOUS

## 2020-04-25 MED ORDER — PROTAMINE SULFATE 10 MG/ML IV SOLN
INTRAVENOUS | Status: DC | PRN
  Administered 2020-04-25: 190 mg via INTRAVENOUS
  Administered 2020-04-25: 10 mg via INTRAVENOUS

## 2020-04-25 MED ORDER — FAMOTIDINE IN NACL 20-0.9 MG/50ML-% IV SOLN
20.0000 mg | Freq: Two times a day (BID) | INTRAVENOUS | Status: AC
  Administered 2020-04-25 (×2): 20 mg via INTRAVENOUS
  Filled 2020-04-25 (×2): qty 50

## 2020-04-25 MED ORDER — OXYCODONE HCL 5 MG PO TABS: 5.0000 mg | ORAL_TABLET | ORAL | Status: DC | PRN

## 2020-04-25 MED ORDER — MIDAZOLAM HCL 5 MG/5ML IJ SOLN
INTRAMUSCULAR | Status: DC | PRN
  Administered 2020-04-25: 1 mg via INTRAVENOUS
  Administered 2020-04-25 (×2): 2 mg via INTRAVENOUS
  Administered 2020-04-25: 1 mg via INTRAVENOUS
  Administered 2020-04-25 (×2): 2 mg via INTRAVENOUS

## 2020-04-25 MED ORDER — CHLORHEXIDINE GLUCONATE 0.12% ORAL RINSE (MEDLINE KIT)
15.0000 mL | Freq: Two times a day (BID) | OROMUCOSAL | Status: DC
  Administered 2020-04-25 – 2020-04-28 (×4): 15 mL via OROMUCOSAL

## 2020-04-25 MED ORDER — PLASMA-LYTE 148 IV SOLN
INTRAVENOUS | Status: DC | PRN
  Administered 2020-04-25: 500 mL via INTRAVASCULAR

## 2020-04-25 MED ORDER — SODIUM CHLORIDE 0.9 % IV SOLN: INTRAVENOUS | Status: DC

## 2020-04-25 MED ORDER — SODIUM CHLORIDE 0.9 % IV SOLN: 250.0000 mL | INTRAVENOUS | Status: DC

## 2020-04-25 MED ORDER — PROPOFOL 10 MG/ML IV BOLUS
INTRAVENOUS | Status: AC
  Filled 2020-04-25: qty 20

## 2020-04-25 MED ORDER — SODIUM CHLORIDE 0.9 % IV SOLN
1.5000 g | Freq: Two times a day (BID) | INTRAVENOUS | Status: AC
  Administered 2020-04-26 – 2020-04-27 (×3): 1.5 g via INTRAVENOUS
  Filled 2020-04-25 (×3): qty 1.5

## 2020-04-25 MED ORDER — ACETAMINOPHEN 500 MG PO TABS
1000.0000 mg | ORAL_TABLET | Freq: Four times a day (QID) | ORAL | Status: AC
  Administered 2020-04-25 – 2020-04-30 (×18): 1000 mg via ORAL
  Filled 2020-04-25 (×19): qty 2

## 2020-04-25 MED ORDER — DEXMEDETOMIDINE HCL IN NACL 400 MCG/100ML IV SOLN: 0.0000 ug/kg/h | INTRAVENOUS | Status: DC

## 2020-04-25 MED ORDER — MAGNESIUM SULFATE 4 GM/100ML IV SOLN
4.0000 g | Freq: Once | INTRAVENOUS | Status: AC
  Administered 2020-04-25: 4 g via INTRAVENOUS
  Filled 2020-04-25: qty 100

## 2020-04-25 MED ORDER — BISACODYL 5 MG PO TBEC
10.0000 mg | DELAYED_RELEASE_TABLET | Freq: Every day | ORAL | Status: DC
  Administered 2020-04-26 – 2020-05-02 (×4): 10 mg via ORAL
  Filled 2020-04-25 (×6): qty 2

## 2020-04-25 MED ORDER — AMIODARONE HCL IN DEXTROSE 360-4.14 MG/200ML-% IV SOLN
30.0000 mg/h | INTRAVENOUS | Status: DC
  Administered 2020-04-25: 30 mg/h via INTRAVENOUS
  Filled 2020-04-25: qty 200

## 2020-04-25 MED ORDER — DOCUSATE SODIUM 100 MG PO CAPS
200.0000 mg | ORAL_CAPSULE | Freq: Every day | ORAL | Status: DC
  Administered 2020-04-26 – 2020-04-29 (×3): 200 mg via ORAL
  Administered 2020-05-02: 100 mg via ORAL
  Filled 2020-04-25 (×6): qty 2

## 2020-04-25 MED ORDER — MIDAZOLAM HCL (PF) 10 MG/2ML IJ SOLN
INTRAMUSCULAR | Status: AC
  Filled 2020-04-25: qty 2

## 2020-04-25 MED ORDER — SODIUM CHLORIDE 0.9% FLUSH: 10.0000 mL | INTRAVENOUS | Status: DC | PRN

## 2020-04-25 MED ORDER — ACETAMINOPHEN 160 MG/5ML PO SOLN: 1000.0000 mg | Freq: Four times a day (QID) | ORAL | Status: DC

## 2020-04-25 MED ORDER — SODIUM CHLORIDE 0.9 % IV SOLN
20.0000 ug | Freq: Once | INTRAVENOUS | Status: DC
  Filled 2020-04-25: qty 5

## 2020-04-25 MED ORDER — INSULIN REGULAR(HUMAN) IN NACL 100-0.9 UT/100ML-% IV SOLN: INTRAVENOUS | Status: DC

## 2020-04-25 MED ORDER — PHENYLEPHRINE 40 MCG/ML (10ML) SYRINGE FOR IV PUSH (FOR BLOOD PRESSURE SUPPORT)
PREFILLED_SYRINGE | INTRAVENOUS | Status: DC | PRN
  Administered 2020-04-25: 80 ug via INTRAVENOUS

## 2020-04-25 MED ORDER — VASOPRESSIN 20 UNIT/ML IV SOLN
INTRAVENOUS | Status: DC | PRN
  Administered 2020-04-25: 1 [IU] via INTRAVENOUS

## 2020-04-25 MED ORDER — SODIUM CHLORIDE 0.9% FLUSH: 3.0000 mL | INTRAVENOUS | Status: DC | PRN

## 2020-04-25 MED ORDER — PROPOFOL 10 MG/ML IV BOLUS
INTRAVENOUS | Status: DC | PRN
  Administered 2020-04-25: 90 mg via INTRAVENOUS

## 2020-04-25 MED ORDER — SODIUM CHLORIDE (PF) 0.9 % IJ SOLN
OROMUCOSAL | Status: DC | PRN
  Administered 2020-04-25 (×3): 4 mL via TOPICAL

## 2020-04-25 MED ORDER — AMIODARONE HCL IN DEXTROSE 360-4.14 MG/200ML-% IV SOLN
60.0000 mg/h | INTRAVENOUS | Status: AC
  Administered 2020-04-25: 60 mg/h via INTRAVENOUS
  Filled 2020-04-25: qty 200

## 2020-04-25 MED ORDER — MORPHINE SULFATE (PF) 2 MG/ML IV SOLN
1.0000 mg | INTRAVENOUS | Status: DC | PRN
  Administered 2020-04-25 – 2020-04-26 (×2): 2 mg via INTRAVENOUS
  Filled 2020-04-25 (×2): qty 1

## 2020-04-25 MED ORDER — ASPIRIN EC 325 MG PO TBEC
325.0000 mg | DELAYED_RELEASE_TABLET | Freq: Every day | ORAL | Status: DC
  Administered 2020-04-26 – 2020-04-30 (×5): 325 mg via ORAL
  Filled 2020-04-25 (×6): qty 1

## 2020-04-25 MED ORDER — SODIUM CHLORIDE 0.9% FLUSH
10.0000 mL | Freq: Two times a day (BID) | INTRAVENOUS | Status: DC
  Administered 2020-04-25 – 2020-04-26 (×4): 10 mL

## 2020-04-25 MED ORDER — 0.9 % SODIUM CHLORIDE (POUR BTL) OPTIME
TOPICAL | Status: DC | PRN
  Administered 2020-04-25: 5000 mL

## 2020-04-25 MED ORDER — ACETAMINOPHEN 650 MG RE SUPP
650.0000 mg | Freq: Once | RECTAL | Status: AC
  Administered 2020-04-25: 650 mg via RECTAL

## 2020-04-25 MED ORDER — METOPROLOL TARTRATE 5 MG/5ML IV SOLN: 2.5000 mg | INTRAVENOUS | Status: DC | PRN

## 2020-04-25 MED ORDER — FENTANYL CITRATE (PF) 250 MCG/5ML IJ SOLN
INTRAMUSCULAR | Status: DC | PRN
Start: 2020-04-25 — End: 2020-04-25
  Administered 2020-04-25 (×2): 100 ug via INTRAVENOUS
  Administered 2020-04-25: 50 ug via INTRAVENOUS
  Administered 2020-04-25 (×2): 100 ug via INTRAVENOUS
  Administered 2020-04-25: 50 ug via INTRAVENOUS
  Administered 2020-04-25: 250 ug via INTRAVENOUS
  Administered 2020-04-25: 50 ug via INTRAVENOUS
  Administered 2020-04-25 (×2): 100 ug via INTRAVENOUS
  Administered 2020-04-25: 50 ug via INTRAVENOUS

## 2020-04-25 MED ORDER — ROCURONIUM BROMIDE 10 MG/ML (PF) SYRINGE
PREFILLED_SYRINGE | INTRAVENOUS | Status: DC | PRN
  Administered 2020-04-25 (×4): 50 mg via INTRAVENOUS
  Administered 2020-04-25: 100 mg via INTRAVENOUS

## 2020-04-25 MED ORDER — VANCOMYCIN HCL IN DEXTROSE 1-5 GM/200ML-% IV SOLN: 1000.0000 mg | Freq: Once | INTRAVENOUS | Status: DC

## 2020-04-25 MED FILL — Magnesium Sulfate Inj 50%: INTRAMUSCULAR | Qty: 10 | Status: AC

## 2020-04-25 MED FILL — Potassium Chloride Inj 2 mEq/ML: INTRAVENOUS | Qty: 40 | Status: AC

## 2020-04-25 MED FILL — Heparin Sodium (Porcine) Inj 1000 Unit/ML: INTRAMUSCULAR | Qty: 30 | Status: AC

## 2020-04-25 SURGICAL SUPPLY — 92 items
ADAPTER CARDIO PERF ANTE/RETRO (ADAPTER) ×5 IMPLANT
ATRICLIP EXCLUSION VLAA SYSTEM (Miscellaneous) ×5 IMPLANT
BAG DECANTER FOR FLEXI CONT (MISCELLANEOUS) ×5 IMPLANT
BLADE CLIPPER SURG (BLADE) ×5 IMPLANT
BLADE STERNUM SYSTEM 6 (BLADE) ×5 IMPLANT
BLADE SURG 11 STRL SS (BLADE) ×5 IMPLANT
BLADE SURG 12 STRL SS (BLADE) ×5 IMPLANT
BNDG ELASTIC 4X5.8 VLCR STR LF (GAUZE/BANDAGES/DRESSINGS) ×5 IMPLANT
BNDG ELASTIC 6X10 VLCR STRL LF (GAUZE/BANDAGES/DRESSINGS) ×5 IMPLANT
BNDG ELASTIC 6X5.8 VLCR STR LF (GAUZE/BANDAGES/DRESSINGS) ×5 IMPLANT
BNDG GAUZE ELAST 4 BULKY (GAUZE/BANDAGES/DRESSINGS) ×5 IMPLANT
CANISTER SUCT 3000ML PPV (MISCELLANEOUS) ×5 IMPLANT
CANNULA GUNDRY RCSP 15FR (MISCELLANEOUS) ×5 IMPLANT
CATH CPB KIT VANTRIGT (MISCELLANEOUS) ×5 IMPLANT
CATH ROBINSON RED A/P 18FR (CATHETERS) ×15 IMPLANT
CATH THORACIC 28FR RT ANG (CATHETERS) ×5 IMPLANT
CLAMP ISOLATOR SYNERGY LG (MISCELLANEOUS) ×5 IMPLANT
CLAMP OLL ABLATION (MISCELLANEOUS) ×5 IMPLANT
CLIP RETRACTION 3.0MM CORONARY (MISCELLANEOUS) ×5 IMPLANT
DEFOGGER ANTIFOG KIT (MISCELLANEOUS) ×5 IMPLANT
DERMABOND ADHESIVE PROPEN (GAUZE/BANDAGES/DRESSINGS) ×2
DERMABOND ADVANCED .7 DNX6 (GAUZE/BANDAGES/DRESSINGS) ×3 IMPLANT
DRAIN CHANNEL 32F RND 10.7 FF (WOUND CARE) ×5 IMPLANT
DRAPE CARDIOVASCULAR INCISE (DRAPES) ×2
DRAPE SLUSH/WARMER DISC (DRAPES) ×5 IMPLANT
DRAPE SRG 135X102X78XABS (DRAPES) ×3 IMPLANT
DRSG AQUACEL AG ADV 3.5X14 (GAUZE/BANDAGES/DRESSINGS) ×5 IMPLANT
ELECT BLADE 4.0 EZ CLEAN MEGAD (MISCELLANEOUS) ×5
ELECT BLADE 6.5 EXT (BLADE) ×5 IMPLANT
ELECT CAUTERY BLADE 6.4 (BLADE) ×5 IMPLANT
ELECT REM PT RETURN 9FT ADLT (ELECTROSURGICAL) ×10
ELECTRODE BLDE 4.0 EZ CLN MEGD (MISCELLANEOUS) ×3 IMPLANT
ELECTRODE REM PT RTRN 9FT ADLT (ELECTROSURGICAL) ×6 IMPLANT
FELT TEFLON 1X6 (MISCELLANEOUS) ×10 IMPLANT
GAUZE SPONGE 4X4 12PLY STRL (GAUZE/BANDAGES/DRESSINGS) ×10 IMPLANT
GLOVE BIO SURGEON STRL SZ 6.5 (GLOVE) ×20 IMPLANT
GLOVE BIO SURGEON STRL SZ7.5 (GLOVE) ×15 IMPLANT
GLOVE BIO SURGEONS STRL SZ 6.5 (GLOVE) ×5
GOWN STRL REUS W/ TWL LRG LVL3 (GOWN DISPOSABLE) ×30 IMPLANT
GOWN STRL REUS W/TWL LRG LVL3 (GOWN DISPOSABLE) ×20
HEMOSTAT POWDER SURGIFOAM 1G (HEMOSTASIS) ×15 IMPLANT
HEMOSTAT SURGICEL 2X14 (HEMOSTASIS) ×5 IMPLANT
KIT BASIN OR (CUSTOM PROCEDURE TRAY) ×5 IMPLANT
KIT SUCTION CATH 14FR (SUCTIONS) ×5 IMPLANT
KIT TURNOVER KIT B (KITS) ×5 IMPLANT
KIT VASOVIEW HEMOPRO 2 VH 4000 (KITS) ×5 IMPLANT
LEAD PACING MYOCARDI (MISCELLANEOUS) ×5 IMPLANT
LOOP VESSEL SUPERMAXI WHITE (MISCELLANEOUS) ×5 IMPLANT
MARKER GRAFT CORONARY BYPASS (MISCELLANEOUS) ×15 IMPLANT
NS IRRIG 1000ML POUR BTL (IV SOLUTION) ×25 IMPLANT
PACK E OPEN HEART (SUTURE) ×5 IMPLANT
PACK OPEN HEART (CUSTOM PROCEDURE TRAY) ×5 IMPLANT
PAD ARMBOARD 7.5X6 YLW CONV (MISCELLANEOUS) ×10 IMPLANT
PAD ELECT DEFIB RADIOL ZOLL (MISCELLANEOUS) ×5 IMPLANT
PENCIL BUTTON HOLSTER BLD 10FT (ELECTRODE) ×5 IMPLANT
POSITIONER HEAD DONUT 9IN (MISCELLANEOUS) ×5 IMPLANT
PUNCH AORTIC ROTATE 4.5MM 8IN (MISCELLANEOUS) ×5 IMPLANT
SEALANT SURG COSEAL 8ML (VASCULAR PRODUCTS) ×5 IMPLANT
SET CARDIOPLEGIA MPS 5001102 (MISCELLANEOUS) ×5 IMPLANT
SPONGE LAP 18X18 RF (DISPOSABLE) ×5 IMPLANT
SPONGE LAP 4X18 RFD (DISPOSABLE) ×5 IMPLANT
SUPPORT HEART JANKE-BARRON (MISCELLANEOUS) ×5 IMPLANT
SURGIFLO W/THROMBIN 8M KIT (HEMOSTASIS) ×5 IMPLANT
SUT BONE WAX W31G (SUTURE) ×5 IMPLANT
SUT MNCRL AB 4-0 PS2 18 (SUTURE) ×5 IMPLANT
SUT PROLENE 3 0 SH DA (SUTURE) ×15 IMPLANT
SUT PROLENE 4 0 RB 1 (SUTURE) ×4
SUT PROLENE 4 0 SH DA (SUTURE) ×20 IMPLANT
SUT PROLENE 4-0 RB1 .5 CRCL 36 (SUTURE) ×6 IMPLANT
SUT PROLENE 6 0 C 1 30 (SUTURE) ×35 IMPLANT
SUT PROLENE 6 0 CC (SUTURE) ×30 IMPLANT
SUT PROLENE 8 0 BV175 6 (SUTURE) ×5 IMPLANT
SUT PROLENE BLUE 7 0 (SUTURE) ×15 IMPLANT
SUT SILK  1 MH (SUTURE) ×2
SUT SILK 1 MH (SUTURE) ×3 IMPLANT
SUT STEEL 6MS V (SUTURE) ×10 IMPLANT
SUT STEEL SZ 6 DBL 3X14 BALL (SUTURE) ×5 IMPLANT
SUT VIC AB 1 CTX 36 (SUTURE) ×10
SUT VIC AB 1 CTX36XBRD ANBCTR (SUTURE) ×15 IMPLANT
SUT VIC AB 2-0 CT1 27 (SUTURE) ×2
SUT VIC AB 2-0 CT1 TAPERPNT 27 (SUTURE) ×3 IMPLANT
SYR BULB IRRIG 60ML STRL (SYRINGE) ×10 IMPLANT
SYSTEM EXCLUSION ATRICLIP VLAA (Miscellaneous) ×3 IMPLANT
SYSTEM SAHARA CHEST DRAIN ATS (WOUND CARE) ×5 IMPLANT
TAPE CLOTH SURG 4X10 WHT LF (GAUZE/BANDAGES/DRESSINGS) ×5 IMPLANT
TAPE PAPER 3X10 WHT MICROPORE (GAUZE/BANDAGES/DRESSINGS) ×5 IMPLANT
TOWEL GREEN STERILE (TOWEL DISPOSABLE) ×5 IMPLANT
TOWEL GREEN STERILE FF (TOWEL DISPOSABLE) ×5 IMPLANT
TRAY FOLEY SLVR 16FR TEMP STAT (SET/KITS/TRAYS/PACK) ×5 IMPLANT
TUBING LAP HI FLOW INSUFFLATIO (TUBING) ×5 IMPLANT
UNDERPAD 30X36 HEAVY ABSORB (UNDERPADS AND DIAPERS) ×5 IMPLANT
WATER STERILE IRR 1000ML POUR (IV SOLUTION) ×10 IMPLANT

## 2020-04-25 NOTE — Op Note (Signed)
NAME: VICENTE, WEIDLER MEDICAL RECORD OE:[id number] ACCOUNT [id number] DATE OF BIRTH:1946/05/09 FACILITY: [facility] LOCATION: [facility] PHYSICIAN:Tearsa Kowalewski VAN TRIGT III, MD  OPERATIVE REPORT  DATE OF PROCEDURE:  04/25/2020  OPERATION: 1.  Coronary artery bypass grafting x3 (left internal mammary artery to left anterior descending, saphenous vein graft to diagonal, saphenous vein graft to obtuse marginal 1). 2.  Left atrial maze procedure with bilateral pulmonary vein isolation using radiofrequency ablation. 3.  Placement of left atrial clip AtriCure 40 mm device. 4.  Endoscopic vein harvest from right leg.  SURGEON:  Ivin Poot, MD  ASSISTANT:  Enid Cutter PA-C.  PREOPERATIVE DIAGNOSES:   1.  Severe coronary artery disease. 2.  Paroxysmal atrial fibrillation. 3.  Unstable angina.  POSTOPERATIVE DIAGNOSES:   1.  Severe coronary artery disease. 2.  Paroxysmal atrial fibrillation. 3.  Unstable angina.  ANESTHESIA:  General.  CLINICAL NOTE:  The patient is a 74 year old gentleman with symptoms of progressive chest tightness and recent diagnosis of atrial fibrillation.  He was evaluated with cardiac catheterization following a positive stress test.  The cardiac catheterization  showed severe multivessel coronary artery disease and he was recommended for urgent coronary artery bypass surgery.  I saw the patient in consultation after reviewing the images of his coronary arteriogram and echocardiogram.  I agreed with the  recommendation from his cardiologist that coronary bypass surgery would be the best treatment for his severe multivessel coronary disease and provide best relief of angina, preservation of LV function, and improved long-term survival.  I discussed the  procedure of coronary bypass grafting in combination with a left atrial maze procedure with the patient and his wife.  I discussed the major aspects of the surgery including the use of general anesthesia and  cardiopulmonary bypass, the location of the  surgical incisions, and the expected postoperative hospital recovery.  I discussed with the patient and his wife the risks to him of the surgery, including the risk of stroke, bleeding, blood transfusion requirement, infection, organ failure,  postoperative pulmonary problems including pleural effusion, postoperative pacemaker requirement, and death.  After reviewing these issues, he demonstrated his understanding and agreed to proceed with surgery under what I felt an informed consent.  OPERATIVE FINDINGS: 1.  Coronaries diffusely diseased and small, but adequate targets. 2.  Good conduit. 3.  Preoperative, intraoperative anemia requiring transfusion of 2 units packed cells. 4.  Preservation of LV function after cervix after separation from cardiopulmonary bypass in a sinus rhythm.  DESCRIPTION OF PROCEDURE:  The patient was brought from the preoperative holding area where informed consent was documented and final issues were addressed with the patient.  He was placed supine on the operating table and general anesthesia was induced  under invasive hemodynamic monitoring.  He remained stable.  A transesophageal echo probe was placed by the anesthesia team.  The patient was then positioned and prepped and draped as a sterile field.  A proper time-out was performed.  A sternal incision was made as the saphenous vein was harvested endoscopically from the right leg.  The left internal mammary artery was  harvested as a pedicle graft from its origin at the subclavian vessels.  It was a 1.5 mm vessel with good flow.  The sternal retractor was placed and the pericardium was opened and suspended.  The ascending aorta had areas of thick calcification.  A pursestring was placed in the aorta to avoid the heavy calcified area.  A second pursestring was placed in the right  atrium.  After the vein was harvested, the patient was heparinized and through the  pursestrings, the patient was cannulated and placed on bypass.  The coronary arteries were identified for grafting.  The LAD, diagonal and OM1 were adequate targets.  The distal  circumflex was too small to graft.  The right coronary had no significant disease.  Cardioplegia cannulas were placed both antegrade and retrograde cold blood cardioplegia, and the patient was cooled to 32 degrees.  Aortic crossclamp was applied and 1 L of cold blood cardioplegia was delivered in split doses between the antegrade aortic  and retrograde coronary sinus catheters.  First, the bilateral pulmonary vein isolation ablation lines were placed.  The heart was raised to expose the left-sided pulmonary veins.  The ligament of Ruthann Cancer was taken down and a vessel loop was placed around the base of the left-sided pulmonary  veins.  A bipolar ablation clamp was then placed at the junction of the pulmonary veins, the left atrium and 3 ablation lines were created.  The clamp was removed.  Next, the base of the left atrial appendage was clamped and ablation lines were made with the radiofrequency device.  Next, after cardioplegia, the right-sided pulmonary veins were isolated and dissected from the mediastinum and encircled with a vessel loop.  The radiofrequency ablation clamp was then placed at the junction of the right-sided pulmonary veins in the left  atrium and 3 ablation lines were created.  The clamp was removed.  This completed the bilateral pulmonary vein isolation.  Cardioplegia was redosed.  Next, the OM1 was identified and the heart positioned.  The OM1 was intramyocardial.  It was 1.7 mm vessel, proximal 90% stenosis.  A reverse saphenous vein was sewn end-to-side with running 7-0 Prolene with good flow through the graft.  Cardioplegia was  redosed.  Second distal anastomosis was the first diagonal branch of LAD.  This had a proximal 80% stenosis.  It was 1.4 mm vessel.  A reverse saphenous vein was sewn  end-to-side with running 7-0 Prolene with good flow through the graft.  Cardioplegia was redosed.  The third distal anastomosis was the distal LAD.  More proximally, the LAD was intramyocardial and heavily calcified.  The left IMA pedicle was brought through an opening in the left lateral pericardium and was brought down onto the LAD and sewn  end-to-side with running 8-0 Prolene.  The LAD was a 1.5 mm vessel.  There was good flow through the anastomosis after briefly releasing the bulldog clamp on the mammary pedicle.  The bulldog was reapplied and the pedicle was secured to the epicardium  with 6-0 Prolenes.  Cardioplegia was redosed.  The patient was then rewarmed as the proximal vein anastomosis for the OM was created to the ascending aorta using a 4.5 mm punch and running 6-0 Prolene.  Because of the poor quality and calcified areas of the descending aorta, the proximal anastomosis  of the diagonal was not placed to the aorta, it was placed to the hood of the circumflex vein graft.  This was performed in an end-to-side anastomosis with running 7-0 Prolene.  Next, air was vented from the coronaries with a dose of retrograde warm blood cardioplegia and the crossclamp was removed.  The heart resumed a spontaneous rhythm.  He was not in atrial fibrillation.  The vein grafts were deaired and opened and each had good flow.  Hemostasis was documented at the proximal and distal anastomoses.  The patient was rewarmed and reperfused.   Temporary pacing  wires were applied.  The patient was AV sequentially paced.  The lungs were expanded.  After the patient had been adequately rewarmed and reperfused, he was weaned from cardiopulmonary bypass without difficulty on low-dose inotropic  support - milrinone.  Cardiac output was normal.  Echo showed preserved LV function.  The patient was given protamine without adverse reaction.  There was still significant coagulopathy after reversal of heparin, so the patient  was given some FFP.  The  mediastinum was irrigated.  The superior pericardial fat was closed over the aorta and vein grafts.  Bilateral pleural tubes and anterior mediastinal chest tubes were placed and brought out through separate incisions.  The sternum was closed with wire.   The patient remained stable.  The pectoralis fascia was closed with a running #1 Vicryl.  Subcutaneous and skin layers were closed with running Vicryl.  Total cardiopulmonary bypass time was 129 minutes.  VN/NUANCE  D:04/25/2020 T:04/25/2020 JOB:[id number]

## 2020-04-25 NOTE — Anesthesia Procedure Notes (Signed)
Procedure Name: Intubation Date/Time: 04/25/2020 7:52 AM Performed by: Griffin Dakin, CRNA Pre-anesthesia Checklist: Patient identified, Emergency Drugs available, Suction available and Patient being monitored Patient Re-evaluated:Patient Re-evaluated prior to induction Oxygen Delivery Method: Circle system utilized Preoxygenation: Pre-oxygenation with 100% oxygen Induction Type: IV induction Ventilation: Mask ventilation without difficulty Laryngoscope Size: Mac and 4 Grade View: Grade I Tube type: Oral Tube size: 8.0 mm Number of attempts: 1 Airway Equipment and Method: Stylet Placement Confirmation: ETT inserted through vocal cords under direct vision,  positive ETCO2 and breath sounds checked- equal and bilateral Secured at: 22 cm Tube secured with: Tape Dental Injury: Teeth and Oropharynx as per pre-operative assessment

## 2020-04-25 NOTE — Progress Notes (Signed)
Pre Procedure note for inpatients:   Michael Velazquez has been scheduled for Procedure(s): CORONARY ARTERY BYPASS GRAFTING (CABG) (N/A) MAZE (N/A) CLIPPING OF ATRIAL APPENDAGE (Left) TRANSESOPHAGEAL ECHOCARDIOGRAM (TEE) (N/A) today. The various methods of treatment have been discussed with the patient. After consideration of the risks, benefits and treatment options the patient has consented to the planned procedure.   The patient has been seen and labs reviewed. There are no changes in the patient's condition to prevent proceeding with the planned procedure today.  Recent labs:  Lab Results  Component Value Date   WBC 10.9 (H) 04/25/2020   HGB 12.4 (L) 04/25/2020   HCT 36.3 (L) 04/25/2020   PLT 219 04/25/2020   GLUCOSE 243 (H) 04/25/2020   CHOL 110 10/05/2018   TRIG 114 10/05/2018   HDL 37 (L) 10/05/2018   LDLCALC 50 10/05/2018   ALT 20 04/25/2020   AST 20 04/25/2020   NA 137 04/25/2020   K 3.6 04/25/2020   CL 100 04/25/2020   CREATININE 1.12 04/25/2020   BUN 17 04/25/2020   CO2 27 04/25/2020   TSH 1.744 04/24/2020   PSA 0.4 11/02/2014   INR 1.1 04/24/2020   HGBA1C 8.1 (H) 04/25/2020    [name], MD 04/25/2020 7:25 AM

## 2020-04-25 NOTE — Care Management (Signed)
04-25-20 1512 Patient presented for dyspnea on exertion-post CABG today. Case Manager will continue to follow for transition of care needs. Bethena Roys, RN,BSN Case Manager

## 2020-04-25 NOTE — Transfer of Care (Signed)
Immediate Anesthesia Transfer of Care Note  Patient: Michael Velazquez  Procedure(s) Performed: CORONARY ARTERY BYPASS GRAFTING (CABG) x 3 (N/A Chest) MAZE (N/A ) CLIPPING OF ATRIAL APPENDAGE (Left ) TRANSESOPHAGEAL ECHOCARDIOGRAM (TEE) (N/A )  Patient Location: ICU  Anesthesia Type:General  Level of Consciousness: Patient remains intubated per anesthesia plan  Airway & Oxygen Therapy: Patient remains intubated per anesthesia plan and Patient placed on Ventilator (see vital sign flow sheet for setting)  Post-op Assessment: Report given to RN and Post -op Vital signs reviewed and stable  Post vital signs: Reviewed and stable  Last Vitals:  Vitals Value Taken Time  BP    Temp    Pulse 80 04/25/20 1419  Resp 0 04/25/20 1419  SpO2 100 % 04/25/20 1419  Vitals shown include unvalidated device data.  Last Pain:  Vitals:   04/25/20 0520  TempSrc: Oral  PainSc: 0-No pain      Patients Stated Pain Goal: 3 (98/92/11 9417)  Complications: No complications documented.

## 2020-04-25 NOTE — Procedures (Signed)
Extubation Procedure Note  Pt extubated following Cardiac Rapid Wean. NIF -22, VC 1.2L. No stridor, cuff leak present. Pt extubated to 2L [place]  Patient Details:   Name: BRION SOSSAMON DOB: 04/29/1946 MRN: [id number]   Airway Documentation:    Vent end date: 04/25/20 Vent end time: 2010   Evaluation  O2 sats: stable throughout Complications: No apparent complications Patient did tolerate procedure well. Bilateral Breath Sounds: Clear, Diminished   Yes  Marissa Nestle 04/25/2020, 8:10 PM

## 2020-04-25 NOTE — Anesthesia Procedure Notes (Signed)
Arterial Line Insertion Start/End9/30/2021 6:45 AM, 04/25/2020 7:00 AM Performed by: Lillia Abed, MD, Griffin Dakin, CRNA, CRNA  Patient location: Pre-op. Preanesthetic checklist: patient identified, IV checked, site marked, risks and benefits discussed, surgical consent, monitors and equipment checked, pre-op evaluation, timeout performed and anesthesia consent Lidocaine 1% used for infiltration Right, radial was placed Catheter size: 20 Fr Hand hygiene performed  and maximum sterile barriers used   Attempts: 1 Procedure performed without using ultrasound guided technique. Following insertion, dressing applied. Post procedure assessment: normal and unchanged  Patient tolerated the procedure well with no immediate complications.

## 2020-04-25 NOTE — Progress Notes (Signed)
  Echocardiogram Echocardiogram Transesophageal has been performed.  Geoffery Lyons Swaim 04/25/2020, 9:11 AM

## 2020-04-25 NOTE — Anesthesia Procedure Notes (Signed)
Central Venous Catheter Insertion Performed by: Lillia Abed, MD, anesthesiologist Start/End9/30/2021 6:45 AM, 04/25/2020 7:00 AM Patient location: Pre-op. Preanesthetic checklist: patient identified, IV checked, risks and benefits discussed, surgical consent, monitors and equipment checked, pre-op evaluation, timeout performed and anesthesia consent Position: Trendelenburg Patient sedated Hand hygiene performed  and maximum sterile barriers used  Catheter size: 8.5 Fr PA cath was placed.MAC introducer Swan type:thermodilution Procedure performed using ultrasound guided technique. Ultrasound Notes:anatomy identified, needle tip was noted to be adjacent to the nerve/plexus identified, no ultrasound evidence of intravascular and/or intraneural injection and image(s) printed for medical record Attempts: 1 Following insertion, line sutured, dressing applied and Biopatch. Post procedure assessment: blood return through all ports, free fluid flow and no air  Patient tolerated the procedure well with no immediate complications.

## 2020-04-25 NOTE — Brief Op Note (Addendum)
04/24/2020 - 04/25/2020  12:10 PM  PATIENT:  Michael Velazquez  74 y.o. male  PRE-OPERATIVE DIAGNOSIS:  Coronary artery disease, Angina Pectoris, Atrial Fibrillation   POST-OPERATIVE DIAGNOSIS:  Coronary artery disease, Angina Pectoris, Atrial Fibrillation   PROCEDURES:  CORONARY ARTERY BYPASS GRAFTING x 3   LIMA->LAD  SVG->D1  SVG->OM1 ENDSOSCOPIC VEIN HARVEST Vein harvest time: 76min  Vein Prep Time: 39min MAZE Procedure - bilateral LA pulmonary vein isolation  CLIPPING OF ATRIAL APPENDAGE   TRANSESOPHAGEAL ECHOCARDIOGRAM  SURGEON:  Ivin Poot, MD - Primary  PHYSICIAN ASSISTANT: [place]   ANESTHESIA:   general  EBL:  Per perfusion and anesthesia records  BLOOD ADMINISTERED: PRBC's, FFP   DRAINS: Bilateral pleural and mediastinal drains   LOCAL MEDICATIONS USED:  NONE  SPECIMEN:  No Specimen  DISPOSITION OF SPECIMEN:  N/A  COUNTS:  YES  DICTATION: .Dragon Dictation  PLAN OF CARE: Admit to inpatient   PATIENT DISPOSITION:  ICU - intubated and hemodynamically stable.   Delay start of Pharmacological VTE agent (>24hrs) due to surgical blood loss or risk of bleeding: yes

## 2020-04-25 NOTE — Anesthesia Preprocedure Evaluation (Addendum)
Anesthesia Evaluation  Patient identified by MRN, date of birth, ID band Patient awake    Reviewed: Allergy & Precautions, NPO status , Patient's Chart, lab work & pertinent test results  Airway Mallampati: I  TM Distance: >3 FB Neck ROM: Full    Dental  (+) Missing, Dental Advisory Given,    Pulmonary neg pulmonary ROS,    breath sounds clear to auscultation       Cardiovascular hypertension, Pt. on medications and Pt. on home beta blockers + CAD   Rhythm:Regular Rate:Normal     Neuro/Psych negative neurological ROS  negative psych ROS   GI/Hepatic negative GI ROS, Neg liver ROS,   Endo/Other  diabetes, Type 2, Oral Hypoglycemic Agents  Renal/GU Renal InsufficiencyRenal disease     Musculoskeletal negative musculoskeletal ROS (+)   Abdominal Normal abdominal exam  (+)   Peds  Hematology negative hematology ROS (+)   Anesthesia Other Findings   Reproductive/Obstetrics                            Anesthesia Physical Anesthesia Plan  ASA: IV  Anesthesia Plan: General   Post-op Pain Management:    Induction: Intravenous  PONV Risk Score and Plan: 3 and Ondansetron and Treatment may vary due to age or medical condition  Airway Management Planned: Oral ETT  Additional Equipment: Arterial line, CVP, PA Cath, TEE and Ultrasound Guidance Line Placement  Intra-op Plan:   Post-operative Plan: Post-operative intubation/ventilation  Informed Consent: I have reviewed the patients History and Physical, chart, labs and discussed the procedure including the risks, benefits and alternatives for the proposed anesthesia with the patient or authorized representative who has indicated his/her understanding and acceptance.     Dental advisory given  Plan Discussed with: CRNA  Anesthesia Plan Comments: (Echo:  1. Left ventricular ejection fraction, by estimation, is 60 to 65%. The  left  ventricle has normal function. The left ventricle has no regional  wall motion abnormalities. There is mild left ventricular hypertrophy.  Left ventricular diastolic parameters  are indeterminate.  2. Right ventricular systolic function is normal. The right ventricular  size is normal.  3. Left atrial size was mildly dilated.  4. The mitral valve is normal in structure. Mild mitral valve  regurgitation. No evidence of mitral stenosis.  5. The aortic valve is tricuspid. Aortic valve regurgitation is not  visualized. No aortic stenosis is present.  6. The inferior vena cava is normal in size with greater than 50%  respiratory variability, suggesting right atrial pressure of 3 mmHg. )       Anesthesia Quick Evaluation

## 2020-04-25 NOTE — Progress Notes (Signed)
ANTICOAGULATION CONSULT NOTE - Follow Up Consult  Pharmacy Consult for heparin Indication: CAD awaiting CABG  Labs: Recent Labs    04/22/20 1543 04/24/20 1813 04/25/20 0139  HGB 12.3*  --   --   HCT 35.6*  --   --   PLT 230  --   --   LABPROT  --  13.3  --   INR  --  1.1  --   HEPARINUNFRC  --   --  0.20*  CREATININE 1.25*  --   --     Assessment: 73yo male subtherapeutic on heparin with initial dosing post cath; no gtt issues or signs of bleeding per RN.  Goal of Therapy:  Heparin level 0.3-0.7 units/ml   Plan:  Will increase heparin gtt by 2-3 units/kg/hr to 1100 units/hr until off for OR.    Wynona Neat, PharmD, BCPS  04/25/2020,2:46 AM

## 2020-04-25 NOTE — Discharge Summary (Addendum)
Michael Velazquez       [place] [address]             [phone]                Patient ID: Michael Velazquez MRN: [id number] DOB/AGE: Feb 27, 1946 74 y.o.   Admit date: 04/24/2020 Discharge date: 04/25/2020   Admission Diagnoses: 1. Unstable angina ([place]) 2. Coronary artery disease 3. Atrial fibrillation   Discharge Diagnoses:  1. S/p CABG x 3 2. Hisotry of borderline renal insufficiency 3. History of DM type 2 (diabetes mellitus, type 2) ([place]) 4. History of HTN (hypertension) 5. History of hypertriglyceridemia 6. History of hyperlipidemia     Procedure (s):   1.  Coronary artery bypass grafting x3 (left internal mammary artery to left anterior descending, saphenous vein graft to diagonal, saphenous vein graft to obtuse marginal 1). 2.  Left atrial maze procedure with bilateral pulmonary vein isolation using radiofrequency ablation. 3.  Placement of left atrial clip AtriCure 40 mm device. 4.  Endoscopic vein harvest from right leg.   History of Presenting Illness: Michael Velazquez is a very pleasant 74 year old retired [employer] with a past medical history significant for hypertension, type 2 diabetes mellitus (last A1c 7.4), dyslipidemia, and positive family history for premature heart disease.  He reports that for about 1 year, he has been having intermittent episodes of dyspnea with exertion and occasional "stinging" central chest pain that would resolve with rest.  Easily episodes have not increased in severity in recent months but he has noticed increase in frequency.  He brought this to the attention of his PCP and ultimately of referral was made to cardiology.  He was seen by Dr. Rozann Lesches and cardiac work-up was initiated.  An echocardiogram showed an ejection fraction of 60 to 65% with mild mitral insufficiency.  A Lexiscan nuclear stress test showed evidence of inferior wall ischemia.  The study also incidentally documented atrial fibrillation  which was a new diagnosis.  Given these findings, left heart catheterization was recommended.  This study was performed electively earlier today and demonstrates severe two-vessel coronary artery disease.  There is a 50% calcified left main lesion followed by a 95% stenosis in the proximal LAD.  Additionally, there is a proximal 70% and mid to distal 75 to 80% stenosis in the d. the second diagonal has a 70% lesion.  A large circumflex coronary artery had a 95% stenosis in the first obtuse marginal.  The right coronary artery was a large vessel without proximal obstructive disease.  There was a 75% stenosis beyond the origin of the posterior descending coronary artery.  Estimated ejection fraction was 65% and LVEDP was normal.  Given these findings, cardiology service elected to admit Michael Velazquez to the hospital for IV heparin and for consideration of urgent coronary bypass grafting. Michael Velazquez denies any history of stroke, TIA, vascular disease, or lower extremity varicosities. On a recent dental visit he was told he needed at least 1 filling and 1 crown.  Dr. Prescott Gum discussed the need for coronary artery bypass grafting surgery. Potential risks, benefits, and complications of the surgery were discussed with the patient and he agreed to proceed with surgery. Pre operative carotid duplex US showed no significant internal carotid artery stenosis bilaterally. Patient underwent a CABG x 3 on 04/25/2020.   Brief Hospital Course:   The patient was extubated the evening of surgery.  He was weaned Milrinone as hemodynamics allowed.  His chest tubes and arterial lines were removed without difficulty.  He had Atrial Fibrillation post operatively.  He was treated with IV Amiodarone with successful conversion to Sinus Bradycardia.  This required back up pacing.  He was started on diuretics for volume overload status.  He developed an AKI likely related to diuretic usage.  His creatinine level peaked at 1.57 and his  most recent level is 1.19 .  He was felt stable for transfer to the progressive care unit on 04/28/2020.  The patient continued to make progress.  He remains in Sinus Bradycardia on Amiodarone.  He is hypertensive and was started on Lisinopril and Norvasc for this.  His HR and blood pressure remained elevated.  He converted back into rate controlled A. Fibrillation.  He was started on Lopressor 25 mg BID for this.   He was treated with additional IV diuresis.  His pacing wires have been removed without difficulty.  His surgical incisions are healing without evidence of infection. He is medically stable for discharge home today.    Latest Vital Signs: Blood pressure (!) 153/76, pulse 72, temperature 98.8 F (37.1 C), temperature source Oral, resp. rate 18, height 5\' 6"  (1.676 m), weight 68.1 kg, SpO2 92 %.   Physical Exam:  General appearance: alert, cooperative and no distress Heart: irregularly irregular rhythm Lungs: clear to auscultation bilaterally Abdomen: soft, non-tender; bowel sounds normal; no masses,  no organomegaly Extremities: edema trace Wound: clean and dry   Discharge Condition: Stable and discharged to home.   Recent laboratory studies:  Recent Labs       Lab Results  Component Value Date    WBC 10.9 (H) 04/25/2020    HGB 8.2 (L) 04/25/2020    HCT 24.0 (L) 04/25/2020    MCV 87.5 04/25/2020    PLT 115 (L) 04/25/2020      Recent Labs       Lab Results  Component Value Date    NA 140 04/25/2020    K 3.7 04/25/2020    CL 102 04/25/2020    CO2 27 04/25/2020    CREATININE 0.70 04/25/2020    GLUCOSE 156 (H) 04/25/2020          Diagnostic Studies:  Imaging Results  DG Chest 2 View   Result Date: 04/24/2020 CLINICAL DATA:  Preop hypertension EXAM: CHEST - 2 VIEW COMPARISON:  Report 05/28/2016 FINDINGS: The heart size and mediastinal contours are within normal limits. Both lungs are clear. Degenerative changes of the spine. IMPRESSION: No active cardiopulmonary  disease. Electronically Signed   By: Donavan Foil M.D.   On: 04/24/2020 20:03    CARDIAC CATHETERIZATION   Result Date: 04/24/2020  Conclusion Severe two-vessel coronary disease including 50% calcified distal left main. 95 to 99% ostial LAD, proximal 70%, mid to distal 75 to 80%. Second diagonal 70%. First obtuse marginal, very large, and contains 95% mid vessel stenosis. Continuation of circumflex beyond the first marginal is 70% obstructed. It is followed by a moderate sized second obtuse marginal. The right coronary is large vessel without high-grade obstruction in the proximal and mid vessel. There is eccentric 75% stenosis beyond the origin of the PDA supplying small left ventricular branches. Normal LV function. Normal LVEDP. Estimated ejection fraction 65%. RECOMMENDATIONS: Increase statin therapy intensity to 80 mg/day. Discontinue sildenafil. Discontinue nonsteroidal anti-inflammatory agents. T CTS consultation ASAP as an outpatient. If chest pain is not relieved by sublingual nitroglycerin, go to the emergency room. If chest pain occurs at rest, go to  the emergency room.   Severe two-vessel coronary disease including 50% calcified distal left main.  95 to 99% ostial LAD, proximal 70%, mid to distal 75 to 80%.  Second diagonal 70%.  First obtuse marginal, very large, and contains 95% mid vessel stenosis.  Continuation of circumflex beyond the first marginal is 70% obstructed.  It is followed by a moderate sized second obtuse marginal.  The right coronary is large vessel without high-grade obstruction in the proximal and mid vessel.  There is eccentric 75% stenosis beyond the origin of the PDA supplying small left ventricular branches.  Normal LV function.  Normal LVEDP.  Estimated ejection fraction 65%. RECOMMENDATIONS:  Increase statin therapy intensity to 80 mg/day.  Discontinue sildenafil.  Discontinue nonsteroidal anti-inflammatory agents.  T CTS consultation ASAP as an outpatient.  If  chest pain is not relieved by sublingual nitroglycerin, go to the emergency room.  If chest pain occurs at rest, go to the emergency room.    NM Myocar Multi W/Spect W/Wall Motion / EF   Result Date: 04/16/2020  Patient noted to be in rate controlled atrial fibrillation throughout study, this appears to be a new diagnosis for him.  There was no ST segment deviation noted during stress.  No T wave inversion was noted during stress.  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  The left ventricular ejection fraction is normal (55-65%).  Low to intermediate risk study     ECHOCARDIOGRAM COMPLETE   Result Date: 04/16/2020    ECHOCARDIOGRAM REPORT   Patient Name:   Michael Velazquez Date of Exam: 04/16/2020 Medical Rec #:  [id number]        Height:       66.0 in Accession #:    [id number]       Weight:       155.0 lb Date of Birth:  1945/08/03       BSA:          1.794 m Patient Age:    68 years         BP:           139/81 mmHg Patient Gender: M                HR:           72 bpm. Exam Location:  [facility] Procedure: 2D Echo, Cardiac Doppler and Color Doppler Indications:    Dyspnea 786.09 / R06.00, Murmur 785.2 / R01.1  History:        Patient has no prior history of Echocardiogram examinations.                 Risk Factors:Hypertension, Diabetes and Dyslipidemia.  Sonographer:    Alvino Chapel RCS Referring Phys: [place]  1. Left ventricular ejection fraction, by estimation, is 60 to 65%. The left ventricle has normal function. The left ventricle has no regional wall motion abnormalities. There is mild left ventricular hypertrophy. Left ventricular diastolic parameters are indeterminate.  2. Right ventricular systolic function is normal. The right ventricular size is normal.  3. Left atrial size was mildly dilated.  4. The mitral valve is normal in structure. Mild mitral valve regurgitation. No evidence of mitral  stenosis.  5. The aortic valve is tricuspid. Aortic valve regurgitation is not visualized. No aortic stenosis is present.  6. The inferior vena cava is normal in size with greater than 50% respiratory variability, suggesting right atrial pressure of  3 mmHg. FINDINGS  Left Ventricle: Left ventricular ejection fraction, by estimation, is 60 to 65%. The left ventricle has normal function. The left ventricle has no regional wall motion abnormalities. The left ventricular internal cavity size was normal in size. There is  mild left ventricular hypertrophy. Left ventricular diastolic parameters are indeterminate. Right Ventricle: The right ventricular size is normal. No increase in right ventricular wall thickness. Right ventricular systolic function is normal. Left Atrium: Left atrial size was mildly dilated. Right Atrium: Right atrial size was normal in size. Pericardium: There is no evidence of pericardial effusion. Mitral Valve: The mitral valve is normal in structure. Mild mitral valve regurgitation. No evidence of mitral valve stenosis. Tricuspid Valve: The tricuspid valve is normal in structure. Tricuspid valve regurgitation is not demonstrated. No evidence of tricuspid stenosis. Aortic Valve: The aortic valve is tricuspid. Aortic valve regurgitation is not visualized. No aortic stenosis is present. Aortic valve mean gradient measures 4.3 mmHg. Aortic valve peak gradient measures 7.5 mmHg. Aortic valve area, by VTI measures 2.16 cm. Pulmonic Valve: The pulmonic valve was not well visualized. Pulmonic valve regurgitation is not visualized. No evidence of pulmonic stenosis. Aorta: The aortic root is normal in size and structure. Venous: The inferior vena cava is normal in size with greater than 50% respiratory variability, suggesting right atrial pressure of 3 mmHg. IAS/Shunts: No atrial level shunt detected by color flow Doppler.  LEFT VENTRICLE PLAX 2D LVIDd:         4.33 cm LVIDs:         2.82 cm LV PW:          1.15 cm LV IVS:        1.17 cm LVOT diam:     1.90 cm LV SV:         64 LV SV Index:   36 LVOT Area:     2.84 cm  RIGHT VENTRICLE TAPSE (M-mode): 2.0 cm LEFT ATRIUM             Index       RIGHT ATRIUM           Index LA diam:        2.90 cm 1.62 cm/m  RA Area:     17.10 cm LA Vol (A2C):   93.6 ml 52.16 ml/m RA Volume:   46.70 ml  26.02 ml/m LA Vol (A4C):   39.0 ml 21.73 ml/m LA Biplane Vol: 67.0 ml 37.34 ml/m  AORTIC VALVE AV Area (Vmax):    2.18 cm AV Area (Vmean):   2.04 cm AV Area (VTI):     2.16 cm AV Vmax:           137.26 cm/s AV Vmean:          96.959 cm/s AV VTI:            0.297 m AV Peak Grad:      7.5 mmHg AV Mean Grad:      4.3 mmHg LVOT Vmax:         105.60 cm/s LVOT Vmean:        69.600 cm/s LVOT VTI:          0.226 m LVOT/AV VTI ratio: 0.76  AORTA Ao Root diam: 3.00 cm MITRAL VALVE MV Area (PHT): 3.33 cm     SHUNTS MV Decel Time: 228 msec     Systemic VTI:  0.23 m MV E velocity: 134.00 cm/s  Systemic Diam: 1.90 cm Carlyle Dolly MD Electronically signed by  Carlyle Dolly MD Signature Date/Time: 04/16/2020/3:30:56 PM    Final     VAS US DOPPLER PRE CABG   Result Date: 04/24/2020 PREOPERATIVE VASCULAR EVALUATION  Indications:  Pre-CABG. Risk Factors: Hypertension, Diabetes. Performing Technologist: Griffin Basil RCT RDMS  Examination Guidelines: A complete evaluation includes B-mode imaging, spectral Doppler, color Doppler, and power Doppler as needed of all accessible portions of each vessel. Bilateral testing is considered an integral part of a complete examination. Limited examinations for reoccurring indications may be performed as noted.  Right Carotid Findings: +----------+--------+--------+--------+--------+------------------+           PSV cm/sEDV cm/sStenosisDescribeComments           +----------+--------+--------+--------+--------+------------------+ CCA Prox  123     20                                          +----------+--------+--------+--------+--------+------------------+ CCA Distal98      19                                         +----------+--------+--------+--------+--------+------------------+ ICA Prox  144     29      1-39%           intimal thickening +----------+--------+--------+--------+--------+------------------+ ICA Distal111     39                                         +----------+--------+--------+--------+--------+------------------+ ECA       175     19                                         +----------+--------+--------+--------+--------+------------------+ Portions of this table do not appear on this page. +----------+--------+-------+--------+------------+           PSV cm/sEDV cmsDescribeArm Pressure +----------+--------+-------+--------+------------+ Subclavian232     5                           +----------+--------+-------+--------+------------+ +---------+--------+--+--------+--+ VertebralPSV cm/s90EDV cm/s32 +---------+--------+--+--------+--+ Left Carotid Findings: +----------+--------+--------+--------+--------+------------------+           PSV cm/sEDV cm/sStenosisDescribeComments           +----------+--------+--------+--------+--------+------------------+ CCA Prox  158     34                                         +----------+--------+--------+--------+--------+------------------+ CCA Distal93      27                                         +----------+--------+--------+--------+--------+------------------+ ICA Prox  125     26      1-39%           intimal thickening +----------+--------+--------+--------+--------+------------------+ ICA Distal115     39                                         +----------+--------+--------+--------+--------+------------------+  ECA       127     9                                          +----------+--------+--------+--------+--------+------------------+  +----------+--------+--------+--------+------------+ SubclavianPSV cm/sEDV cm/sDescribeArm Pressure +----------+--------+--------+--------+------------+           228     4                            +----------+--------+--------+--------+------------+ +---------+--------+---+--------+--+ VertebralPSV cm/s102EDV cm/s19 +---------+--------+---+--------+--+  ABI Findings: +--------+------------------+-----+---------+--------+ Right   Rt Pressure (mmHg)IndexWaveform Comment  +--------+------------------+-----+---------+--------+ [id number]                    triphasic         +--------+------------------+-----+---------+--------+ PTA     160               1.04 triphasic         +--------+------------------+-----+---------+--------+ DP      156               1.01 triphasic         +--------+------------------+-----+---------+--------+ +--------+------------------+-----+---------+-------+ Left    Lt Pressure (mmHg)IndexWaveform Comment +--------+------------------+-----+---------+-------+ [id number]                    triphasic        +--------+------------------+-----+---------+-------+ PTA     158               1.03 triphasic        +--------+------------------+-----+---------+-------+ DP      159               1.03 triphasic        +--------+------------------+-----+---------+-------+  Right Doppler Findings: +--------+--------+-----+---------+--------+ Site    PressureIndexDoppler  Comments +--------+--------+-----+---------+--------+ [id number]          triphasic         +--------+--------+-----+---------+--------+ Radial               triphasic         +--------+--------+-----+---------+--------+ Ulnar                triphasic         +--------+--------+-----+---------+--------+  Left Doppler Findings: +--------+--------+-----+---------+--------+ Site    PressureIndexDoppler  Comments  +--------+--------+-----+---------+--------+ [id number]          triphasic         +--------+--------+-----+---------+--------+ Radial               triphasic         +--------+--------+-----+---------+--------+ Ulnar                triphasic         +--------+--------+-----+---------+--------+  Summary: Right Carotid: Velocities in the right ICA are consistent with a 1-39% stenosis. Left Carotid: Velocities in the left ICA are consistent with a 1-39% stenosis. Vertebrals: Bilateral vertebral arteries demonstrate antegrade flow. Right ABI: Resting right ankle-brachial index is within normal range. No evidence of significant right lower extremity arterial disease. Left ABI: Resting left ankle-brachial index is within normal range. No evidence of significant left lower extremity arterial disease. Right Upper Extremity: Doppler waveform obliterate with right radial compression. Doppler waveforms remain within normal limits with right ulnar compression. Left Upper Extremity: Doppler waveform obliterate with left radial compression. Doppler waveforms remain within normal limits with left ulnar compression.  Preliminary                Discharge Medications:      Allergies as of 04/25/2020       Reactions    Yellow Jacket Venom [bee Venom] Hives, Shortness Of Breath      Allergies as of 05/02/2020       Reactions   Yellow Jacket Venom [bee Venom] Hives, Shortness Of Breath        Medication List     STOP taking these medications    Ibuprofen PM 200-25 MG Caps Generic drug: Ibuprofen-diphenhydrAMINE HCl   naproxen sodium 220 MG tablet Commonly known as: ALEVE   sildenafil 100 MG tablet Commonly known as: VIAGRA       TAKE these medications    amiodarone 200 MG tablet Commonly known as: PACERONE Take 1 tablet (200 mg total) by mouth 2 (two) times daily.   amLODipine 10 MG tablet Commonly known as: NORVASC TAKE 1 TABLET BY MOUTH EVERY DAY   apixaban 5 MG  Tabs tablet Commonly known as: ELIQUIS Take 1 tablet (5 mg total) by mouth 2 (two) times daily.   ascorbic acid 500 MG tablet Commonly known as: VITAMIN C Take 500 mg by mouth every other day.   aspirin 81 MG tablet Take 81 mg by mouth daily.   atenolol 50 MG tablet Commonly known as: TENORMIN TAKE 1 TABLET BY MOUTH TWICE DAILY   atorvastatin 80 MG tablet Commonly known as: Lipitor Take 1 tablet (80 mg total) by mouth daily. What changed:  medication strength how much to take   cholecalciferol 25 MCG (1000 UNIT) tablet Commonly known as: VITAMIN D3 Take 1,000 Units by mouth every other day.   DERMEND BRUISE FORMULA EX Apply 1 application topically every other day.   EPINEPHrine 0.3 mg/0.3 mL Soaj injection Commonly known as: EPI-PEN Inject 0.3 mg into the muscle as needed for anaphylaxis.   furosemide 40 MG tablet Commonly known as: Lasix Take 1 tablet (40 mg total) by mouth daily for 3 doses.   lansoprazole 15 MG capsule Commonly known as: PREVACID Take 15 mg by mouth daily as needed (heartburn).   lisinopril-hydrochlorothiazide 20-25 MG tablet Commonly known as: ZESTORETIC TAKE 1 TABLET BY MOUTH EVERY DAY   metFORMIN 1000 MG tablet Commonly known as: GLUCOPHAGE TAKE 1 TABLET BY MOUTH EVERY DAY   metoprolol tartrate 25 MG tablet Commonly known as: LOPRESSOR Take 1 tablet (25 mg total) by mouth 2 (two) times daily.   sitaGLIPtin 100 MG tablet Commonly known as: Januvia Take 1 tablet (100 mg total) by mouth daily.   traMADol 50 MG tablet Commonly known as: ULTRAM Take 1-2 tablets (50-100 mg total) by mouth every 4 (four) hours as needed for moderate pain.   vitamin B-12 1000 MCG tablet Commonly known as: CYANOCOBALAMIN Take 1,000 mcg by mouth every other day.               Durable Medical Equipment  (From admission, onward)           Start     Ordered   05/01/20 1213  For home use only DME Walker rolling  Once       Comments: Post op    Question Answer Comment  Walker: With 5 Inch Wheels   Patient needs a walker to treat with the following condition Weakness      05/01/20 1212  The patient has been discharged on:     1.Beta Blocker:  Yes [ X  ]                              No   [  ]                              If No, reason:   2.Ace Inhibitor/ARB: Yes [   ]                                     No  [ X   ]                                     If No, reason:    3.Statin:   Yes [ X  ]                  No  [   ]                  If No, reason:   4.Ecasa:  Yes  [ X  ]                  No   [   ]                  If No, reason:    Follow Up Appointments:       Follow-up Information         Satira Sark, MD. Go on 05/21/2020.   Specialty: Cardiology Why: Appointment time is at 11:20 am Contact information: [address] [place] [address] [phone]                 Ivin Poot, MD. Go on 05/29/2020.   Specialty: Cardiothoracic Surgery Why: PA/LAT CXR to be taken (at [facility] which is in the same builidng as Dr. Lucianne Lei Trigt's office) on 11/03 at 10;00 am;Appointment is at 10:30 am Contact information: [address] [place] [address] [phone]                 Kathyrn Drown, MD. Call.   Specialty: Family Medicine Why: for a follow up regarding further diabetes management and sureillance of HGA1C 8.1 Contact information: [place] [address] [phone]                       Signed:  Sharalyn Ink ZimmermanPA-C 04/25/2020, 2:07 PM    patient examined and medical record reviewed,agree with above note. [name] 05/09/2020

## 2020-04-26 ENCOUNTER — Encounter (HOSPITAL_COMMUNITY): Payer: Self-pay | Admitting: Cardiothoracic Surgery

## 2020-04-26 ENCOUNTER — Inpatient Hospital Stay (HOSPITAL_COMMUNITY): Payer: Medicare Other

## 2020-04-26 LAB — BASIC METABOLIC PANEL
Anion gap: 9 (ref 5–15)
Anion gap: 11 (ref 5–15)
BUN: 13 mg/dL (ref 8–23)
BUN: 18 mg/dL (ref 8–23)
CO2: 21 mmol/L — ABNORMAL LOW (ref 22–32)
CO2: 22 mmol/L (ref 22–32)
Calcium: 8.1 mg/dL — ABNORMAL LOW (ref 8.9–10.3)
Calcium: 8.1 mg/dL — ABNORMAL LOW (ref 8.9–10.3)
Chloride: 106 mmol/L (ref 98–111)
Chloride: 102 mmol/L (ref 98–111)
Creatinine, Ser: 0.98 mg/dL (ref 0.61–1.24)
Creatinine, Ser: 1.34 mg/dL — ABNORMAL HIGH (ref 0.61–1.24)
GFR calc Af Amer: >60 mL/min (ref >60)
GFR calc Af Amer: >60 mL/min (ref >60)
GFR calc non Af Amer: >60 mL/min (ref >60)
GFR calc non Af Amer: 53 mL/min — ABNORMAL LOW (ref >60)
Glucose, Bld: 121 mg/dL — ABNORMAL HIGH (ref 70–99)
Glucose, Bld: 244 mg/dL — ABNORMAL HIGH (ref 70–99)
Potassium: 4.4 mmol/L (ref 3.5–5.1)
Potassium: 4.8 mmol/L (ref 3.5–5.1)
Sodium: 136 mmol/L (ref 135–145)
Sodium: 135 mmol/L (ref 135–145)

## 2020-04-26 LAB — BPAM FFP
Blood Product Expiration Date: 202110052359
Blood Product Expiration Date: 202110052359
ISSUE DATE / TIME: 202109301127
ISSUE DATE / TIME: 202109301127
Unit Type and Rh: 6200
Unit Type and Rh: 6200

## 2020-04-26 LAB — GLUCOSE, CAPILLARY
Glucose-Capillary: 131 mg/dL — ABNORMAL HIGH (ref 70–99)
Glucose-Capillary: 116 mg/dL — ABNORMAL HIGH (ref 70–99)
Glucose-Capillary: 82 mg/dL (ref 70–99)
Glucose-Capillary: 232 mg/dL — ABNORMAL HIGH (ref 70–99)
Glucose-Capillary: 230 mg/dL — ABNORMAL HIGH (ref 70–99)
Glucose-Capillary: 253 mg/dL — ABNORMAL HIGH (ref 70–99)
Glucose-Capillary: 191 mg/dL — ABNORMAL HIGH (ref 70–99)

## 2020-04-26 LAB — PREPARE FRESH FROZEN PLASMA
Unit division: 0
Unit division: 0

## 2020-04-26 LAB — CBC
HCT: 31.6 % — ABNORMAL LOW (ref 39.0–52.0)
HCT: 31.4 % — ABNORMAL LOW (ref 39.0–52.0)
Hemoglobin: 10.8 g/dL — ABNORMAL LOW (ref 13.0–17.0)
Hemoglobin: 10.6 g/dL — ABNORMAL LOW (ref 13.0–17.0)
MCH: 30.1 pg (ref 26.0–34.0)
MCH: 30.5 pg (ref 26.0–34.0)
MCHC: 34.2 g/dL (ref 30.0–36.0)
MCHC: 33.8 g/dL (ref 30.0–36.0)
MCV: 88 fL (ref 80.0–100.0)
MCV: 90.2 fL (ref 80.0–100.0)
Platelets: 138 10*3/uL — ABNORMAL LOW (ref 150–400)
Platelets: 125 10*3/uL — ABNORMAL LOW (ref 150–400)
RBC: 3.59 MIL/uL — ABNORMAL LOW (ref 4.22–5.81)
RBC: 3.48 MIL/uL — ABNORMAL LOW (ref 4.22–5.81)
RDW: 13.2 % (ref 11.5–15.5)
RDW: 13.6 % (ref 11.5–15.5)
WBC: 17.4 10*3/uL — ABNORMAL HIGH (ref 4.0–10.5)
WBC: 18.8 10*3/uL — ABNORMAL HIGH (ref 4.0–10.5)
nRBC: 0 % (ref 0.0–0.2)
nRBC: 0 % (ref 0.0–0.2)

## 2020-04-26 LAB — MAGNESIUM
Magnesium: 2.1 mg/dL (ref 1.7–2.4)
Magnesium: 2.1 mg/dL (ref 1.7–2.4)

## 2020-04-26 MED ORDER — AMIODARONE HCL 200 MG PO TABS
400.0000 mg | ORAL_TABLET | Freq: Two times a day (BID) | ORAL | Status: DC
  Administered 2020-04-26 – 2020-04-28 (×6): 400 mg via ORAL
  Filled 2020-04-26 (×6): qty 2

## 2020-04-26 MED ORDER — CHLORHEXIDINE GLUCONATE 0.12 % MT SOLN
OROMUCOSAL | Status: AC
  Administered 2020-04-26: 15 mL via OROMUCOSAL
  Filled 2020-04-26: qty 15

## 2020-04-26 MED ORDER — MIDAZOLAM HCL 2 MG/2ML IJ SOLN: 2.0000 mg | Freq: Four times a day (QID) | INTRAMUSCULAR | Status: DC | PRN

## 2020-04-26 MED ORDER — INSULIN DETEMIR 100 UNIT/ML ~~LOC~~ SOLN
20.0000 [IU] | Freq: Two times a day (BID) | SUBCUTANEOUS | Status: DC
  Administered 2020-04-26 – 2020-04-27 (×2): 20 [IU] via SUBCUTANEOUS
  Filled 2020-04-26 (×5): qty 0.2

## 2020-04-26 MED ORDER — INSULIN DETEMIR 100 UNIT/ML ~~LOC~~ SOLN
12.0000 [IU] | Freq: Two times a day (BID) | SUBCUTANEOUS | Status: DC
  Administered 2020-04-26: 12 [IU] via SUBCUTANEOUS
  Filled 2020-04-26 (×2): qty 0.12

## 2020-04-26 MED ORDER — INSULIN ASPART 100 UNIT/ML ~~LOC~~ SOLN
0.0000 [IU] | SUBCUTANEOUS | Status: DC
  Administered 2020-04-26: 12 [IU] via SUBCUTANEOUS
  Administered 2020-04-26 – 2020-04-27 (×2): 4 [IU] via SUBCUTANEOUS
  Administered 2020-04-27: 2 [IU] via SUBCUTANEOUS
  Administered 2020-04-27: 4 [IU] via SUBCUTANEOUS
  Administered 2020-04-27: 2 [IU] via SUBCUTANEOUS

## 2020-04-26 MED ORDER — FUROSEMIDE 10 MG/ML IJ SOLN
20.0000 mg | Freq: Two times a day (BID) | INTRAMUSCULAR | Status: DC
  Administered 2020-04-26 – 2020-04-28 (×5): 20 mg via INTRAVENOUS
  Filled 2020-04-26 (×5): qty 2

## 2020-04-26 MED ORDER — AMIODARONE HCL 200 MG PO TABS: 400.0000 mg | ORAL_TABLET | Freq: Every day | ORAL | Status: DC

## 2020-04-26 MED FILL — Sodium Bicarbonate IV Soln 8.4%: INTRAVENOUS | Qty: 50 | Status: AC

## 2020-04-26 MED FILL — Mannitol IV Soln 20%: INTRAVENOUS | Qty: 500 | Status: AC

## 2020-04-26 MED FILL — Lidocaine HCl Local Soln Prefilled Syringe 100 MG/5ML (2%): INTRAMUSCULAR | Qty: 5 | Status: AC

## 2020-04-26 MED FILL — Albumin, Human Inj 5%: INTRAVENOUS | Qty: 250 | Status: AC

## 2020-04-26 MED FILL — Sodium Chloride IV Soln 0.9%: INTRAVENOUS | Qty: 2000 | Status: AC

## 2020-04-26 MED FILL — Electrolyte-R (PH 7.4) Solution: INTRAVENOUS | Qty: 7000 | Status: AC

## 2020-04-26 MED FILL — Heparin Sodium (Porcine) Inj 1000 Unit/ML: INTRAMUSCULAR | Qty: 20 | Status: AC

## 2020-04-26 MED FILL — Calcium Chloride Inj 10%: INTRAVENOUS | Qty: 10 | Status: AC

## 2020-04-26 NOTE — Progress Notes (Signed)
TCTS DAILY ICU PROGRESS NOTE                   [address]            [place] [address]          [phone]   1 Day Post-Op Procedure(s) (LRB): CORONARY ARTERY BYPASS GRAFTING (CABG) x 3 (N/A) MAZE (N/A) CLIPPING OF ATRIAL APPENDAGE (Left) TRANSESOPHAGEAL ECHOCARDIOGRAM (TEE) (N/A)  Total Length of Stay:  LOS: 2 days   Subjective: Extubated las evening. Awake and alert. Pain controlled.   Stable hemodynamics on milrinone 0.178mg/kg/min.   Objective: Vital signs in last 24 hours: Temp:  [95.9 F (35.5 C)-99.5 F (37.5 C)] 98.2 F (36.8 C) (10/01 0800) Pulse Rate:  [80-85] 81 (10/01 0800) Cardiac Rhythm: A-V Sequential paced (09/30 2000) Resp:  [0-27] 20 (10/01 0800) BP: (97-167)/(61-85) 130/75 (10/01 0800) SpO2:  [98 %-100 %] 99 % (10/01 0800) Arterial Line BP: (95-187)/(42-76) 168/71 (10/01 0800) FiO2 (%):  [40 %-50 %] 40 % (09/30 1942) Weight:  [77.4 kg] 77.4 kg (10/01 0640)  Filed Weights   04/25/20 0055 04/25/20 0520 04/26/20 0640  Weight: 69.4 kg 68.1 kg 77.4 kg    Weight change: 7.455 kg   Hemodynamic parameters for last 24 hours: PAP: (25-52)/(10-26) 45/24 CO:  [4.1 L/min-4.7 L/min] 4.7 L/min CI:  [2.3 L/min/m2-2.7 L/min/m2] 2.7 L/min/m2  Intake/Output from previous day: 09/30 0701 - 10/01 0700 In: 6002.8 [I.V.:4240.9; Blood:933; IV Piggyback:828.9] Out: 35329[Urine:2100; Blood:1222; Chest Tube:455]  Intake/Output this shift: Total I/O In: 32.5 [I.V.:32.5] Out: 50 [Urine:50]  Current Meds: Scheduled Meds: . acetaminophen  1,000 mg Oral Q6H   Or  . acetaminophen (TYLENOL) oral liquid 160 mg/5 mL  1,000 mg Per Tube Q6H  . amiodarone  400 mg Oral Daily  . aspirin EC  325 mg Oral Daily   Or  . aspirin  324 mg Per Tube Daily  . atorvastatin  80 mg Oral q1800  . bisacodyl  10 mg Oral Daily   Or  . bisacodyl  10 mg Rectal Daily  . chlorhexidine gluconate (MEDLINE KIT)  15 mL Mouth Rinse BID  . Chlorhexidine Gluconate Cloth   6 each Topical Daily  . docusate sodium  200 mg Oral Daily  . furosemide  20 mg Intravenous BID  . metoprolol tartrate  12.5 mg Oral BID   Or  . metoprolol tartrate  12.5 mg Per Tube BID  . [START ON 04/27/2020] pantoprazole  40 mg Oral Daily  . sodium chloride flush  10-40 mL Intracatheter Q12H  . sodium chloride flush  3 mL Intravenous Q12H   Continuous Infusions: . sodium chloride Stopped (04/25/20 2323)  . sodium chloride    . sodium chloride 20 mL/hr at 04/25/20 1405  . albumin human    . cefUROXime (ZINACEF)  IV Stopped (04/26/20 09242  . insulin Stopped (04/26/20 0746)  . lactated ringers    . lactated ringers Stopped (04/25/20 2228)  . phenylephrine (NEO-SYNEPHRINE) Adult infusion Stopped (04/25/20 1652)  . vancomycin 1,000 mg (04/25/20 2200)   PRN Meds:.sodium chloride, albumin human, dextrose, metoprolol tartrate, midazolam, morphine injection, ondansetron (ZOFRAN) IV, oxyCODONE, sodium chloride flush, sodium chloride flush, traMADol  General appearance: alert, cooperative and no distress Neurologic: intact Heart: irregularly irregular rhythm and VR in 70's with pacer off. Currently AV pacing at 80. Lungs: Breath sounds clear, shallow.  Abdomen: Soft, NT, few bowel sounds present.  Extremities: Moderate peripheral edema. All well perfused. RLE EVH incision  intact and dry. Wound: The sternotomy dressing is covered with a dry Aquacel dressing.  Lab Results: CBC: Recent Labs    04/25/20 1946 04/25/20 2005 04/25/20 2125 04/26/20 0544  WBC 13.7*  --   --  17.4*  HGB 10.3*   < > 10.2* 10.8*  HCT 29.7*   < > 30.0* 31.6*  PLT 114*  --   --  138*   < > = values in this interval not displayed.   BMET:  Recent Labs    04/25/20 1946 04/25/20 2005 04/25/20 2125 04/26/20 0544  NA 140   < > 141 136  K 3.5   < > 3.9 4.4  CL 109  --   --  106  CO2 22  --   --  21*  GLUCOSE 126*  --   --  121*  BUN 12  --   --  13  CREATININE 0.92  --   --  0.98  CALCIUM 7.5*  --    --  8.1*   < > = values in this interval not displayed.    CMET: Lab Results  Component Value Date   WBC 17.4 (H) 04/26/2020   HGB 10.8 (L) 04/26/2020   HCT 31.6 (L) 04/26/2020   PLT 138 (L) 04/26/2020   GLUCOSE 121 (H) 04/26/2020   CHOL 143 04/25/2020   TRIG 179 (H) 04/25/2020   HDL 30 (L) 04/25/2020   LDLCALC 77 04/25/2020   ALT 20 04/25/2020   AST 20 04/25/2020   NA 136 04/26/2020   K 4.4 04/26/2020   CL 106 04/26/2020   CREATININE 0.98 04/26/2020   BUN 13 04/26/2020   CO2 21 (L) 04/26/2020   TSH 1.744 04/24/2020   PSA 0.4 11/02/2014   INR 1.7 (H) 04/25/2020   HGBA1C 8.1 (H) 04/25/2020      PT/INR:  Recent Labs    04/25/20 1438  LABPROT 19.4*  INR 1.7*   Radiology: [facility] Chest Port 1 View  Result Date: 04/26/2020 CLINICAL DATA:  Open heart surgery.  Sore chest.  Chest tube. EXAM: PORTABLE CHEST 1 VIEW COMPARISON:  04/25/2020 FINDINGS: Postoperative changes in the mediastinum. Right Swan-Ganz catheter with tip over the right pulmonary artery. Mediastinal drains and bilateral chest tubes are in place. No visible pneumothorax. Heart size is normal for technique. Infiltration or atelectasis in the lower lungs. IMPRESSION: Appliances appear in satisfactory location. Infiltration or atelectasis in the lower lungs. Electronically Signed   By: Lucienne Capers M.D.   On: 04/26/2020 06:56   DG Chest Port 1 View  Result Date: 04/25/2020 CLINICAL DATA:  Postop CABG EXAM: PORTABLE CHEST 1 VIEW COMPARISON:  04/24/2020 FINDINGS: Interval CABG. Endotracheal tube in good position. Swan-Ganz catheter right pulmonary artery. Bilateral chest tubes and mediastinal drain in place. No pneumothorax Mild bibasilar atelectasis.  No edema or effusion. IMPRESSION: Postop CABG.  No pneumothorax Bibasilar atelectasis. Electronically Signed   By: Franchot Gallo M.D.   On: 04/25/2020 14:21     Assessment/Plan: S/P Procedure(s) (LRB): CORONARY ARTERY BYPASS GRAFTING (CABG) x 3 (N/A) MAZE  (N/A) CLIPPING OF ATRIAL APPENDAGE (Left) TRANSESOPHAGEAL ECHOCARDIOGRAM (TEE) (N/A)  -POD-1 CABG x 3 for 2VCAD, MAZE, left atrial clip presenting with crescendo angina, Normal EF.  Doing well, stable hemodynamics and minimal CT drainage.  D/C PA catheter and arterial line, wean milrinone, and mobilize. Begin ASA, low-dose metoprolol.   -Recent onset atrial fibrillation- currently in slow AF ->AV paced at 80 with reasonable capture. On amiodarone infusion, convert to  PO today.   -Type 2 DM- control is adequate on insulin drip, convert to  Sq Levemir and SSI today  -Expected acute blood loss anemia- mild, minimal chest tube output,  Monitor  -Volume Excess- Wt 7kg above pre-op wt. Begin diuresis with Lasix 38m IV bid..Antony Odea PA-C [phone]/07/2019 8:27 AM

## 2020-04-26 NOTE — Anesthesia Postprocedure Evaluation (Signed)
Anesthesia Post Note  Patient: RAMIZ TURPIN  Procedure(s) Performed: CORONARY ARTERY BYPASS GRAFTING (CABG) x 3 (N/A Chest) MAZE (N/A ) CLIPPING OF ATRIAL APPENDAGE (Left ) TRANSESOPHAGEAL ECHOCARDIOGRAM (TEE) (N/A )     Patient location during evaluation: SICU Anesthesia Type: General Level of consciousness: awake and alert Pain management: pain level controlled Vital Signs Assessment: post-procedure vital signs reviewed and stable Respiratory status: spontaneous breathing Cardiovascular status: stable Postop Assessment: no apparent nausea or vomiting Anesthetic complications: no   No complications documented.  Last Vitals:  Vitals:   04/26/20 1200 04/26/20 1300  BP: 131/69 127/79  Pulse: 71 77  Resp: 10 20  Temp:    SpO2: 96% 96%    Last Pain:  Vitals:   04/26/20 0800  TempSrc: Core  PainSc: 2                  Effie Berkshire

## 2020-04-26 NOTE — Progress Notes (Signed)
EVENING ROUNDS NOTE :     [address]       [place] [address]             [phone]                 1 Day Post-Op Procedure(s) (LRB): CORONARY ARTERY BYPASS GRAFTING (CABG) x 3 (N/A) MAZE (N/A) CLIPPING OF ATRIAL APPENDAGE (Left) TRANSESOPHAGEAL ECHOCARDIOGRAM (TEE) (N/A)  Total Length of Stay:  LOS: 2 days  BP 121/69    Pulse 84    Temp 98.6 F (37 C)    Resp 13    Ht 5\' 6"  (1.676 m)    Wt 68.1 kg    SpO2 100%    BMI 24.24 kg/m   .Intake/Output      09/30 0701 - 10/01 0700   I.V. (mL/kg) 4095.1 (60.1)   Blood 933   IV Piggyback 728.9   Total Intake(mL/kg) 5756.9 (84.5)   Urine (mL/kg/hr) 1700 (1)   Blood 1222   Chest Tube 160   Total Output 3082   Net +2674.9          sodium chloride Stopped (04/25/20 2323)   sodium chloride     sodium chloride 20 mL/hr at 04/25/20 1405   albumin human     amiodarone 30 mg/hr (04/26/20 0000)   cefUROXime (ZINACEF)  IV     desmopressin (DDAVP) IV for Bleeding     dexmedetomidine (PRECEDEX) IV infusion Stopped (04/25/20 1545)   epinephrine Stopped (04/25/20 1407)   insulin 1.9 mL/hr at 04/26/20 0000   lactated ringers     lactated ringers Stopped (04/25/20 2228)   milrinone 0.125 mcg/kg/min (04/26/20 0000)   nitroGLYCERIN 0 mcg/min (04/25/20 1743)   phenylephrine (NEO-SYNEPHRINE) Adult infusion Stopped (04/25/20 1652)   vancomycin 1,000 mg (04/25/20 2200)     Lab Results  Component Value Date   WBC 13.7 (H) 04/25/2020   HGB 10.2 (L) 04/25/2020   HCT 30.0 (L) 04/25/2020   PLT 114 (L) 04/25/2020   GLUCOSE 126 (H) 04/25/2020   CHOL 143 04/25/2020   TRIG 179 (H) 04/25/2020   HDL 30 (L) 04/25/2020   LDLCALC 77 04/25/2020   ALT 20 04/25/2020   AST 20 04/25/2020   NA 141 04/25/2020   K 3.9 04/25/2020   CL 109 04/25/2020   CREATININE 0.92 04/25/2020   BUN 12 04/25/2020   CO2 22 04/25/2020   TSH 1.744 04/24/2020   PSA 0.4 11/02/2014   INR 1.7 (H) 04/25/2020   HGBA1C 8.1 (H)  04/25/2020  Extubated without difficulty earlier this evening. Oxygenating well on 2 liters via [place]. Chest tube with minimal output since surgery AV paced Patient sleeping and not awakened  Lars Pinks PA-C 04/26/2020 12:56 AM

## 2020-04-26 NOTE — Progress Notes (Signed)
1 Day Post-Op Procedure(s) (LRB): CORONARY ARTERY BYPASS GRAFTING (CABG) x 3 (N/A) MAZE (N/A) CLIPPING OF ATRIAL APPENDAGE (Left) TRANSESOPHAGEAL ECHOCARDIOGRAM (TEE) (N/A) Subjective: Chest soreness Am EKG a-fib 70's- pacer V backup Objective: Vital signs in last 24 hours: Temp:  [95.9 F (35.5 C)-99.5 F (37.5 C)] 98.2 F (36.8 C) (10/01 0800) Pulse Rate:  [80-85] 81 (10/01 0800) Cardiac Rhythm: A-V Sequential paced (10/01 0800) Resp:  [0-27] 20 (10/01 0800) BP: (97-167)/(61-85) 130/75 (10/01 0800) SpO2:  [98 %-100 %] 99 % (10/01 0800) Arterial Line BP: (95-187)/(42-76) 168/71 (10/01 0800) FiO2 (%):  [40 %-50 %] 40 % (09/30 1942) Weight:  [77.4 kg] 77.4 kg (10/01 0640)  Hemodynamic parameters for last 24 hours: PAP: (25-52)/(10-26) 45/24 CO:  [4.1 L/min-4.7 L/min] 4.7 L/min CI:  [2.3 L/min/m2-2.7 L/min/m2] 2.7 L/min/m2  Intake/Output from previous day: 09/30 0701 - 10/01 0700 In: 6002.8 [I.V.:4240.9; Blood:933; IV Piggyback:828.9] Out: 1324 [Urine:2100; Blood:1222; Chest Tube:455] Intake/Output this shift: Total I/O In: 32.5 [I.V.:32.5] Out: 50 [Urine:50]       Exam    General- alert and breathing comfortably    Neck- no JVD, no cervical adenopathy palpable, no carotid bruit   Lungs- clear without rales, wheezes   Cor- regular rate and rhythm, no murmur , gallop   Abdomen- soft, non-tender   Extremities - warm, non-tender, minimal edema   Neuro- oriented, appropriate, no focal weakness   Lab Results: Recent Labs    04/25/20 1946 04/25/20 2005 04/25/20 2125 04/26/20 0544  WBC 13.7*  --   --  17.4*  HGB 10.3*   < > 10.2* 10.8*  HCT 29.7*   < > 30.0* 31.6*  PLT 114*  --   --  138*   < > = values in this interval not displayed.   BMET:  Recent Labs    04/25/20 1946 04/25/20 2005 04/25/20 2125 04/26/20 0544  NA 140   < > 141 136  K 3.5   < > 3.9 4.4  CL 109  --   --  106  CO2 22  --   --  21*  GLUCOSE 126*  --   --  121*  BUN 12  --   --  13   CREATININE 0.92  --   --  0.98  CALCIUM 7.5*  --   --  8.1*   < > = values in this interval not displayed.    PT/INR:  Recent Labs    04/25/20 1438  LABPROT 19.4*  INR 1.7*   ABG    Component Value Date/Time   PHART 7.417 04/25/2020 2125   HCO3 23.7 04/25/2020 2125   TCO2 25 04/25/2020 2125   ACIDBASEDEF 1.0 04/25/2020 2125   O2SAT 98.0 04/25/2020 2125   CBG (last 3)  Recent Labs    04/26/20 0338 04/26/20 0548 04/26/20 0746  GLUCAP 131* 116* 82    Assessment/Plan: S/P Procedure(s) (LRB): CORONARY ARTERY BYPASS GRAFTING (CABG) x 3 (N/A) MAZE (N/A) CLIPPING OF ATRIAL APPENDAGE (Left) TRANSESOPHAGEAL ECHOCARDIOGRAM (TEE) (N/A) Mobilize Diuresis Diabetes control d/c tubes/lines See progression orders po amiodarone   LOS: 2 days    Michael Velazquez 04/26/2020

## 2020-04-26 NOTE — Progress Notes (Signed)
      [address]       [place] [address]             [phone]    POD # 1 CABG, Maze, LAA clip   Resting  BP 118/66   Pulse 64   Temp 97.8 F (36.6 C)   Resp 18   Ht 5\' 6"  (1.676 m)   Wt 77.4 kg   SpO2 96%   BMI 27.54 kg/m  2L [place] 97% sat Creatinine up slightly to 1.34 CBG elevated in 200s- will adjust meds Hct= 31  [name] C. Roxan Hockey, MD [facility] [phone]

## 2020-04-27 ENCOUNTER — Inpatient Hospital Stay (HOSPITAL_COMMUNITY): Payer: Medicare Other

## 2020-04-27 LAB — BASIC METABOLIC PANEL
Anion gap: 9 (ref 5–15)
BUN: 25 mg/dL — ABNORMAL HIGH (ref 8–23)
CO2: 24 mmol/L (ref 22–32)
Calcium: 8.2 mg/dL — ABNORMAL LOW (ref 8.9–10.3)
Chloride: 101 mmol/L (ref 98–111)
Creatinine, Ser: 1.31 mg/dL — ABNORMAL HIGH (ref 0.61–1.24)
GFR calc Af Amer: >60 mL/min (ref >60)
GFR calc non Af Amer: 54 mL/min — ABNORMAL LOW (ref >60)
Glucose, Bld: 161 mg/dL — ABNORMAL HIGH (ref 70–99)
Potassium: 4.5 mmol/L (ref 3.5–5.1)
Sodium: 134 mmol/L — ABNORMAL LOW (ref 135–145)

## 2020-04-27 LAB — GLUCOSE, CAPILLARY
Glucose-Capillary: 135 mg/dL — ABNORMAL HIGH (ref 70–99)
Glucose-Capillary: 136 mg/dL — ABNORMAL HIGH (ref 70–99)
Glucose-Capillary: 143 mg/dL — ABNORMAL HIGH (ref 70–99)
Glucose-Capillary: 178 mg/dL — ABNORMAL HIGH (ref 70–99)
Glucose-Capillary: 179 mg/dL — ABNORMAL HIGH (ref 70–99)
Glucose-Capillary: 71 mg/dL (ref 70–99)
Glucose-Capillary: 79 mg/dL (ref 70–99)

## 2020-04-27 LAB — CBC
HCT: 30.4 % — ABNORMAL LOW (ref 39.0–52.0)
Hemoglobin: 10.2 g/dL — ABNORMAL LOW (ref 13.0–17.0)
MCH: 30.4 pg (ref 26.0–34.0)
MCHC: 33.6 g/dL (ref 30.0–36.0)
MCV: 90.5 fL (ref 80.0–100.0)
Platelets: 120 10*3/uL — ABNORMAL LOW (ref 150–400)
RBC: 3.36 MIL/uL — ABNORMAL LOW (ref 4.22–5.81)
RDW: 13.3 % (ref 11.5–15.5)
WBC: 18.6 10*3/uL — ABNORMAL HIGH (ref 4.0–10.5)
nRBC: 0 % (ref 0.0–0.2)

## 2020-04-27 MED ORDER — CHLORHEXIDINE GLUCONATE CLOTH 2 % EX PADS
6.0000 | MEDICATED_PAD | Freq: Every day | CUTANEOUS | Status: DC
  Administered 2020-04-28 – 2020-05-02 (×2): 6 via TOPICAL

## 2020-04-27 MED ORDER — CHLORHEXIDINE GLUCONATE 0.12 % MT SOLN
OROMUCOSAL | Status: AC
  Administered 2020-04-27: 15 mL via OROMUCOSAL
  Filled 2020-04-27: qty 15

## 2020-04-27 NOTE — Plan of Care (Signed)
  Problem: Education: Goal: Ability to demonstrate management of disease process will improve Outcome: Progressing Goal: Ability to verbalize understanding of medication therapies will improve Outcome: Progressing Goal: Individualized Educational Video(s) Outcome: Progressing   Problem: Activity: Goal: Capacity to carry out activities will improve Outcome: Progressing   Problem: Cardiac: Goal: Ability to achieve and maintain adequate cardiopulmonary perfusion will improve Outcome: Progressing   Problem: Clinical Measurements: Goal: Ability to maintain clinical measurements within normal limits will improve Outcome: Progressing Goal: Will remain free from infection Outcome: Progressing Goal: Diagnostic test results will improve Outcome: Progressing Goal: Respiratory complications will improve Outcome: Progressing Goal: Cardiovascular complication will be avoided Outcome: Progressing   Problem: Nutrition: Goal: Adequate nutrition will be maintained Outcome: Progressing

## 2020-04-27 NOTE — Progress Notes (Signed)
2 Days Post-Op Procedure(s) (LRB): CORONARY ARTERY BYPASS GRAFTING (CABG) x 3 (N/A) MAZE (N/A) CLIPPING OF ATRIAL APPENDAGE (Left) TRANSESOPHAGEAL ECHOCARDIOGRAM (TEE) (N/A) Subjective: C/o feeling "not dizzy but unfocused" with walk this AM  Objective: Vital signs in last 24 hours: Temp:  [96.4 F (35.8 C)-98.4 F (36.9 C)] 96.4 F (35.8 C) (10/02 0755) Pulse Rate:  [62-77] 62 (10/02 1000) Cardiac Rhythm: Ventricular paced (10/02 0400) Resp:  [10-22] 20 (10/02 1000) BP: (106-157)/(61-79) 119/72 (10/02 1000) SpO2:  [92 %-97 %] 97 % (10/02 1000) Weight:  [77 kg] 77 kg (10/02 0700)  Hemodynamic parameters for last 24 hours:    Intake/Output from previous day: 10/01 0701 - 10/02 0700 In: 749.7 [P.O.:500; I.V.:115.2; IV Piggyback:134.5] Out: 1085 [Urine:645; Chest Tube:440] Intake/Output this shift: No intake/output data recorded.  General appearance: alert, cooperative and no distress Neurologic: intact Heart: regular rate and rhythm Lungs: diminished breath sounds bibasilar Abdomen: normal findings: soft, non-tender  Lab Results: Recent Labs    04/26/20 1644 04/27/20 0330  WBC 18.8* 18.6*  HGB 10.6* 10.2*  HCT 31.4* 30.4*  PLT 125* 120*   BMET:  Recent Labs    04/26/20 1644 04/27/20 0330  NA 135 134*  K 4.8 4.5  CL 102 101  CO2 22 24  GLUCOSE 244* 161*  BUN 18 25*  CREATININE 1.34* 1.31*  CALCIUM 8.1* 8.2*    PT/INR:  Recent Labs    04/25/20 1438  LABPROT 19.4*  INR 1.7*   ABG    Component Value Date/Time   PHART 7.417 04/25/2020 2125   HCO3 23.7 04/25/2020 2125   TCO2 25 04/25/2020 2125   ACIDBASEDEF 1.0 04/25/2020 2125   O2SAT 98.0 04/25/2020 2125   CBG (last 3)  Recent Labs    04/27/20 0007 04/27/20 0332 04/27/20 0631  GLUCAP 179* 136* 143*    Assessment/Plan: S/P Procedure(s) (LRB): CORONARY ARTERY BYPASS GRAFTING (CABG) x 3 (N/A) MAZE (N/A) CLIPPING OF ATRIAL APPENDAGE (Left) TRANSESOPHAGEAL ECHOCARDIOGRAM (TEE)  (N/A) -\POD # 2 CABG, Maze, LAA clip CV- in Sinus brady on amiodarone, V paced- change to AAI RESP- IS RENAL- creatinine stable, lytes Ok ENDO- CBG trending better Anemia secondary to ABL- stable Continue cardiac rehab DC chest tubes, central line and Foley  LOS: 3 days    Melrose Nakayama 04/27/2020

## 2020-04-27 NOTE — Progress Notes (Signed)
Levada Dy RN aware of order to d/c IJ CVC

## 2020-04-28 ENCOUNTER — Inpatient Hospital Stay (HOSPITAL_COMMUNITY): Payer: Medicare Other

## 2020-04-28 LAB — TYPE AND SCREEN
ABO/RH(D): A POS
Antibody Screen: NEGATIVE
Unit division: 0
Unit division: 0
Unit division: 0
Unit division: 0
Unit division: 0

## 2020-04-28 LAB — URINALYSIS, ROUTINE W REFLEX MICROSCOPIC
Bilirubin Urine: NEGATIVE
Glucose, UA: NEGATIVE mg/dL
Ketones, ur: NEGATIVE mg/dL
Leukocytes,Ua: NEGATIVE
Nitrite: NEGATIVE
Protein, ur: NEGATIVE mg/dL
Specific Gravity, Urine: 1.016 (ref 1.005–1.030)
pH: 5 (ref 5.0–8.0)

## 2020-04-28 LAB — CBC
HCT: 32.6 % — ABNORMAL LOW (ref 39.0–52.0)
Hemoglobin: 10.9 g/dL — ABNORMAL LOW (ref 13.0–17.0)
MCH: 30.3 pg (ref 26.0–34.0)
MCHC: 33.4 g/dL (ref 30.0–36.0)
MCV: 90.6 fL (ref 80.0–100.0)
Platelets: 185 10*3/uL (ref 150–400)
RBC: 3.6 MIL/uL — ABNORMAL LOW (ref 4.22–5.81)
RDW: 13 % (ref 11.5–15.5)
WBC: 26 10*3/uL — ABNORMAL HIGH (ref 4.0–10.5)
nRBC: 0 % (ref 0.0–0.2)

## 2020-04-28 LAB — BPAM RBC
Blood Product Expiration Date: 202110272359
Blood Product Expiration Date: 202110272359
Blood Product Expiration Date: 202110282359
Blood Product Expiration Date: 202110282359
Blood Product Expiration Date: 202110292359
ISSUE DATE / TIME: 202109300732
ISSUE DATE / TIME: 202109300732
ISSUE DATE / TIME: 202109301151
Unit Type and Rh: 6200
Unit Type and Rh: 6200
Unit Type and Rh: 6200
Unit Type and Rh: 6200
Unit Type and Rh: 6200

## 2020-04-28 LAB — GLUCOSE, CAPILLARY
Glucose-Capillary: 78 mg/dL (ref 70–99)
Glucose-Capillary: 82 mg/dL (ref 70–99)
Glucose-Capillary: 130 mg/dL — ABNORMAL HIGH (ref 70–99)
Glucose-Capillary: 125 mg/dL — ABNORMAL HIGH (ref 70–99)
Glucose-Capillary: 93 mg/dL (ref 70–99)

## 2020-04-28 LAB — BASIC METABOLIC PANEL
Anion gap: 10 (ref 5–15)
BUN: 31 mg/dL — ABNORMAL HIGH (ref 8–23)
CO2: 22 mmol/L (ref 22–32)
Calcium: 8.4 mg/dL — ABNORMAL LOW (ref 8.9–10.3)
Chloride: 98 mmol/L (ref 98–111)
Creatinine, Ser: 1.56 mg/dL — ABNORMAL HIGH (ref 0.61–1.24)
GFR calc Af Amer: 51 mL/min — ABNORMAL LOW (ref >60)
GFR calc non Af Amer: 44 mL/min — ABNORMAL LOW (ref >60)
Glucose, Bld: 119 mg/dL — ABNORMAL HIGH (ref 70–99)
Potassium: 4 mmol/L (ref 3.5–5.1)
Sodium: 130 mmol/L — ABNORMAL LOW (ref 135–145)

## 2020-04-28 MED ORDER — ENOXAPARIN SODIUM 40 MG/0.4ML ~~LOC~~ SOLN
40.0000 mg | Freq: Every day | SUBCUTANEOUS | Status: DC
  Administered 2020-04-28: 40 mg via SUBCUTANEOUS
  Filled 2020-04-28: qty 0.4

## 2020-04-28 MED ORDER — MAGNESIUM HYDROXIDE 400 MG/5ML PO SUSP
30.0000 mL | Freq: Every day | ORAL | Status: DC | PRN
  Filled 2020-04-28: qty 30

## 2020-04-28 MED ORDER — FUROSEMIDE 40 MG PO TABS
40.0000 mg | ORAL_TABLET | Freq: Every day | ORAL | Status: DC
  Administered 2020-04-28 – 2020-04-30 (×3): 40 mg via ORAL
  Filled 2020-04-28 (×4): qty 1

## 2020-04-28 MED ORDER — SODIUM CHLORIDE 0.9% FLUSH: 3.0000 mL | INTRAVENOUS | Status: DC | PRN

## 2020-04-28 MED ORDER — ~~LOC~~ CARDIAC SURGERY, PATIENT & FAMILY EDUCATION: Freq: Once | Status: AC

## 2020-04-28 MED ORDER — SODIUM CHLORIDE 0.9 % IV SOLN: 250.0000 mL | INTRAVENOUS | Status: DC | PRN

## 2020-04-28 MED ORDER — ALUM & MAG HYDROXIDE-SIMETH 200-200-20 MG/5ML PO SUSP: 15.0000 mL | Freq: Four times a day (QID) | ORAL | Status: DC | PRN

## 2020-04-28 MED ORDER — SODIUM CHLORIDE 0.9% FLUSH
3.0000 mL | Freq: Two times a day (BID) | INTRAVENOUS | Status: DC
  Administered 2020-04-28 – 2020-05-02 (×7): 3 mL via INTRAVENOUS

## 2020-04-28 MED ORDER — INSULIN ASPART 100 UNIT/ML ~~LOC~~ SOLN
0.0000 [IU] | Freq: Three times a day (TID) | SUBCUTANEOUS | Status: DC
  Administered 2020-04-28 (×2): 2 [IU] via SUBCUTANEOUS
  Administered 2020-04-29: 3 [IU] via SUBCUTANEOUS
  Administered 2020-04-29: 5 [IU] via SUBCUTANEOUS
  Administered 2020-04-30: 3 [IU] via SUBCUTANEOUS
  Administered 2020-04-30: 5 [IU] via SUBCUTANEOUS
  Administered 2020-04-30: 8 [IU] via SUBCUTANEOUS
  Administered 2020-05-01 (×2): 3 [IU] via SUBCUTANEOUS
  Administered 2020-05-01: 5 [IU] via SUBCUTANEOUS

## 2020-04-28 NOTE — Progress Notes (Addendum)
3 Days Post-Op Procedure(s) (LRB): CORONARY ARTERY BYPASS GRAFTING (CABG) x 3 (N/A) MAZE (N/A) CLIPPING OF ATRIAL APPENDAGE (Left) TRANSESOPHAGEAL ECHOCARDIOGRAM (TEE) (N/A) Subjective: Feels better, ate a little breakfast this AM  Objective: Vital signs in last 24 hours: Temp:  [97.2 F (36.2 C)-98.2 F (36.8 C)] 97.6 F (36.4 C) (10/03 0711) Pulse Rate:  [62-86] 81 (10/03 0700) Cardiac Rhythm: A-V Sequential paced (10/03 0400) Resp:  [12-21] 14 (10/03 0700) BP: (87-160)/(62-84) 130/74 (10/03 0700) SpO2:  [93 %-99 %] 95 % (10/03 0700) Weight:  [77.7 kg] 77.7 kg (10/03 0500)  Hemodynamic parameters for last 24 hours:    Intake/Output from previous day: 10/02 0701 - 10/03 0700 In: 1440 [P.O.:1320; I.V.:20; IV Piggyback:100] Out: 700 [Urine:700] Intake/Output this shift: No intake/output data recorded.  General appearance: alert, cooperative and no distress Neurologic: intact Heart: regular rate and rhythm Lungs: diminished breath sounds bibasilar Abdomen: normal findings: soft, non-tender  Lab Results: Recent Labs    04/27/20 0330 04/28/20 0034  WBC 18.6* 26.0*  HGB 10.2* 10.9*  HCT 30.4* 32.6*  PLT 120* 185   BMET:  Recent Labs    04/27/20 0330 04/28/20 0034  NA 134* 130*  K 4.5 4.0  CL 101 98  CO2 24 22  GLUCOSE 161* 119*  BUN 25* 31*  CREATININE 1.31* 1.56*  CALCIUM 8.2* 8.4*    PT/INR:  Recent Labs    04/25/20 1438  LABPROT 19.4*  INR 1.7*   ABG    Component Value Date/Time   PHART 7.417 04/25/2020 2125   HCO3 23.7 04/25/2020 2125   TCO2 25 04/25/2020 2125   ACIDBASEDEF 1.0 04/25/2020 2125   O2SAT 98.0 04/25/2020 2125   CBG (last 3)  Recent Labs    04/27/20 2311 04/28/20 0442 04/28/20 0712  GLUCAP 71 78 82    Assessment/Plan: S/P Procedure(s) (LRB): CORONARY ARTERY BYPASS GRAFTING (CABG) x 3 (N/A) MAZE (N/A) CLIPPING OF ATRIAL APPENDAGE (Left) TRANSESOPHAGEAL ECHOCARDIOGRAM (TEE) (N/A) Plan for transfer to step-down: see  transfer orders  CV- in SR about 60- pacer to VVI @ 50 RESP- continue IS RENAL- creatinine elevated from 1.31 to 1.56 c/w AKI post CABG  Monitor ENDO_ CBG normal- dc levemir, change SSI to AC and HS  Continue PO meds Gi- tolerating PO Anemia improved Thrombocytopenia resolved SCD + ambulation for DVT prophylaxis  Will add enoxaparin now that PLT normal Leukocytosis- ? Etiology, central line out, afebrile, no obvious source  monitor  LOS: 4 days    Michael Velazquez 04/28/2020

## 2020-04-28 NOTE — Progress Notes (Signed)
Patient stood at bedside to void. Only voided 100 cc of yellow clear urine. Patient denies feeling need to void after peeing and denies bladder discomfort. No bladder distention noted. Bladder scanned patient once he was back in bed and bladder scan volume result is 4 cc post void.  Will continue to monitor.

## 2020-04-28 NOTE — Progress Notes (Signed)
Pt admitted to [facility] from [place].  Pt A&O X4 and neuro intact.  Pt placed on telemetry and CCMD notified.  CHG bath completed.  Vitals taken and all within normal range.  Pt is currently comfortable with no pain.

## 2020-04-29 LAB — CBC
HCT: 27.4 % — ABNORMAL LOW (ref 39.0–52.0)
Hemoglobin: 9.5 g/dL — ABNORMAL LOW (ref 13.0–17.0)
MCH: 31 pg (ref 26.0–34.0)
MCHC: 34.7 g/dL (ref 30.0–36.0)
MCV: 89.5 fL (ref 80.0–100.0)
Platelets: 178 10*3/uL (ref 150–400)
RBC: 3.06 MIL/uL — ABNORMAL LOW (ref 4.22–5.81)
RDW: 13 % (ref 11.5–15.5)
WBC: 16.3 10*3/uL — ABNORMAL HIGH (ref 4.0–10.5)
nRBC: 0 % (ref 0.0–0.2)

## 2020-04-29 LAB — BASIC METABOLIC PANEL
Anion gap: 8 (ref 5–15)
BUN: 37 mg/dL — ABNORMAL HIGH (ref 8–23)
CO2: 25 mmol/L (ref 22–32)
Calcium: 7.8 mg/dL — ABNORMAL LOW (ref 8.9–10.3)
Chloride: 99 mmol/L (ref 98–111)
Creatinine, Ser: 1.57 mg/dL — ABNORMAL HIGH (ref 0.61–1.24)
GFR calc Af Amer: 50 mL/min — ABNORMAL LOW (ref >60)
GFR calc non Af Amer: 43 mL/min — ABNORMAL LOW (ref >60)
Glucose, Bld: 91 mg/dL (ref 70–99)
Potassium: 4 mmol/L (ref 3.5–5.1)
Sodium: 132 mmol/L — ABNORMAL LOW (ref 135–145)

## 2020-04-29 LAB — GLUCOSE, CAPILLARY
Glucose-Capillary: 95 mg/dL (ref 70–99)
Glucose-Capillary: 216 mg/dL — ABNORMAL HIGH (ref 70–99)
Glucose-Capillary: 186 mg/dL — ABNORMAL HIGH (ref 70–99)
Glucose-Capillary: 168 mg/dL — ABNORMAL HIGH (ref 70–99)

## 2020-04-29 MED ORDER — AMIODARONE HCL 200 MG PO TABS
200.0000 mg | ORAL_TABLET | Freq: Two times a day (BID) | ORAL | Status: DC
  Administered 2020-04-29 – 2020-05-02 (×7): 200 mg via ORAL
  Filled 2020-04-29 (×7): qty 1

## 2020-04-29 MED ORDER — ALUM & MAG HYDROXIDE-SIMETH 200-200-20 MG/5ML PO SUSP: 30.0000 mL | Freq: Four times a day (QID) | ORAL | Status: DC | PRN

## 2020-04-29 MED ORDER — ENOXAPARIN SODIUM 30 MG/0.3ML ~~LOC~~ SOLN
30.0000 mg | Freq: Every day | SUBCUTANEOUS | Status: DC
  Administered 2020-04-29: 30 mg via SUBCUTANEOUS
  Filled 2020-04-29: qty 0.3

## 2020-04-29 MED ORDER — OXYCODONE HCL 5 MG PO TABS: 5.0000 mg | ORAL_TABLET | ORAL | Status: DC | PRN

## 2020-04-29 MED ORDER — LACTULOSE 10 GM/15ML PO SOLN: 20.0000 g | Freq: Every day | ORAL | Status: DC | PRN

## 2020-04-29 NOTE — Progress Notes (Addendum)
      [address]       [place] [address]             [phone]      4 Days Post-Op Procedure(s) (LRB): CORONARY ARTERY BYPASS GRAFTING (CABG) x 3 (N/A) MAZE (N/A) CLIPPING OF ATRIAL APPENDAGE (Left) TRANSESOPHAGEAL ECHOCARDIOGRAM (TEE) (N/A)   Subjective:  Patient complains of indigestion.  Otherwise he has no complaints.  Objective: Vital signs in last 24 hours: Temp:  [97.6 F (36.4 C)-98.2 F (36.8 C)] 97.8 F (36.6 C) (10/04 0543) Pulse Rate:  [59-89] 66 (10/04 0543) Cardiac Rhythm: Normal sinus rhythm;Bundle branch block (10/03 1900) Resp:  [15-21] 21 (10/04 0543) BP: (121-151)/(66-77) 151/68 (10/04 0543) SpO2:  [95 %-97 %] 96 % (10/04 0543)  Intake/Output from previous day: 10/03 0701 - 10/04 0700 In: 480 [P.O.:480] Out: 600 [Urine:600]  General appearance: alert, cooperative and no distress Heart: regular rate and rhythm Lungs: clear to auscultation bilaterally Abdomen: soft, non-tender; bowel sounds normal; no masses,  no organomegaly Extremities: edema mild pitting  Wound: clean and dry  Lab Results: Recent Labs    04/28/20 0034 04/29/20 0117  WBC 26.0* 16.3*  HGB 10.9* 9.5*  HCT 32.6* 27.4*  PLT 185 178   BMET:  Recent Labs    04/28/20 0034 04/29/20 0117  NA 130* 132*  K 4.0 4.0  CL 98 99  CO2 22 25  GLUCOSE 119* 91  BUN 31* 37*  CREATININE 1.56* 1.57*  CALCIUM 8.4* 7.8*    PT/INR: No results for input(s): LABPROT, INR in the last 72 hours. ABG    Component Value Date/Time   PHART 7.417 04/25/2020 2125   HCO3 23.7 04/25/2020 2125   TCO2 25 04/25/2020 2125   ACIDBASEDEF 1.0 04/25/2020 2125   O2SAT 98.0 04/25/2020 2125   CBG (last 3)  Recent Labs    04/28/20 1612 04/28/20 2132 04/29/20 0624  GLUCAP 125* 93 95    Assessment/Plan: S/P Procedure(s) (LRB): CORONARY ARTERY BYPASS GRAFTING (CABG) x 3 (N/A) MAZE (N/A) CLIPPING OF ATRIAL APPENDAGE (Left) TRANSESOPHAGEAL ECHOCARDIOGRAM (TEE) (N/A)  1. CV-  PAF, currently Sinus Bradycardia, Amiodarone 200 mg BID 2. Pulm- no acute issues, continue IS 3. Renal- creatinine up to 1.57 likely due to diuretics use/AKI, pitting edema on exam, weight is up 20 lbs if accurate... will likely need to temporarily stop lasix due to elevated creatinine, K at 4.0 4. Expected post operative blood loss anemia, mild Hgb at 9.5 5. GI- no BM yet, indigestion add Maalox prn, lactulose for constipation 6. CBGs controlled,  Continue SSIP 7. Dispo- patient stable, remains bradycardic on low dose Amiodarone, creatinine elevated but stable at 1.57, possibly stop lasix temporarily, Maalox indigestion, continue current care   LOS: 5 days    Ellwood Handler, PA-C 04/29/2020 Doing well, WBC now improved Maintaining nsr but nauseated- reduce amiodarone to 200 mg bid Cont po lasix and follow creat level Plan to DC EPWs tomorrow then start Eliquis  patient examined and medical record reviewed,agree with above note. [name] 04/29/2020

## 2020-04-29 NOTE — Care Management Important Message (Signed)
Important Message  Patient Details  Name: BIJON MINEER MRN: [id number] Date of Birth: [birth year]   Medicare Important Message Given:  Yes     Shelda Altes 04/29/2020, 5:04 PM

## 2020-04-29 NOTE — Plan of Care (Signed)
Continue to monitor

## 2020-04-29 NOTE — Progress Notes (Signed)
Mobility Specialist - Progress Note   04/29/20 1329  Mobility  Activity Ambulated in hall  Level of Assistance Modified independent, requires aide device or extra time  Assistive Device Front wheel walker  Distance Ambulated (ft) 260 ft  Mobility Response Tolerated well  Mobility performed by Mobility specialist  $Mobility charge 1 Mobility    Pre-mobility: 77 HR During mobility: 92 HR Post-mobility: 78 HR  Pt stated the weakness in his leg felt improved during this bout of ambulation compared to the last one. He was asx throughout.   Pricilla Handler Mobility Specialist Mobility Specialist Phone: [phone]

## 2020-04-29 NOTE — Progress Notes (Signed)
CARDIAC REHAB PHASE I   PRE:  Rate/Rhythm: 64 SR with PVC's  BP:  Supine:   Sitting: 156/78     SaO2: 91 RA  MODE:  Ambulation: 200 ft   POST:  Rate/Rhythm: 85 SR with PVC's  BP:  Supine:   Sitting: 174/73    SaO2: 97 RA  Pt was seated in recliner. Pt was independent getting out of chair but needed cues for sternal precautions. Pt ambulated 200 ft using walker with c/o leg weakness. No other complaints or concerns while ambulating. Pt returned to recliner. Encouraged him to walk 2 more times today. [place], [place], CEP 04/29/2020 9:52 AM

## 2020-04-30 ENCOUNTER — Inpatient Hospital Stay (HOSPITAL_COMMUNITY): Payer: Medicare Other

## 2020-04-30 LAB — BASIC METABOLIC PANEL
Anion gap: 10 (ref 5–15)
BUN: 33 mg/dL — ABNORMAL HIGH (ref 8–23)
CO2: 25 mmol/L (ref 22–32)
Calcium: 8 mg/dL — ABNORMAL LOW (ref 8.9–10.3)
Chloride: 99 mmol/L (ref 98–111)
Creatinine, Ser: 1.42 mg/dL — ABNORMAL HIGH (ref 0.61–1.24)
GFR calc Af Amer: 57 mL/min — ABNORMAL LOW (ref >60)
GFR calc non Af Amer: 49 mL/min — ABNORMAL LOW (ref >60)
Glucose, Bld: 174 mg/dL — ABNORMAL HIGH (ref 70–99)
Potassium: 3.8 mmol/L (ref 3.5–5.1)
Sodium: 134 mmol/L — ABNORMAL LOW (ref 135–145)

## 2020-04-30 LAB — GLUCOSE, CAPILLARY
Glucose-Capillary: 182 mg/dL — ABNORMAL HIGH (ref 70–99)
Glucose-Capillary: 290 mg/dL — ABNORMAL HIGH (ref 70–99)
Glucose-Capillary: 224 mg/dL — ABNORMAL HIGH (ref 70–99)
Glucose-Capillary: 165 mg/dL — ABNORMAL HIGH (ref 70–99)

## 2020-04-30 LAB — CBC
HCT: 26.8 % — ABNORMAL LOW (ref 39.0–52.0)
Hemoglobin: 9.2 g/dL — ABNORMAL LOW (ref 13.0–17.0)
MCH: 30.8 pg (ref 26.0–34.0)
MCHC: 34.3 g/dL (ref 30.0–36.0)
MCV: 89.6 fL (ref 80.0–100.0)
Platelets: 211 10*3/uL (ref 150–400)
RBC: 2.99 MIL/uL — ABNORMAL LOW (ref 4.22–5.81)
RDW: 12.9 % (ref 11.5–15.5)
WBC: 12.7 10*3/uL — ABNORMAL HIGH (ref 4.0–10.5)
nRBC: 0 % (ref 0.0–0.2)

## 2020-04-30 MED ORDER — AMIODARONE HCL IN DEXTROSE 360-4.14 MG/200ML-% IV SOLN
30.0000 mg/h | INTRAVENOUS | Status: DC
  Administered 2020-04-30 (×2): 30 mg/h via INTRAVENOUS
  Filled 2020-04-30 (×3): qty 200

## 2020-04-30 MED ORDER — AMLODIPINE BESYLATE 10 MG PO TABS
10.0000 mg | ORAL_TABLET | Freq: Every day | ORAL | Status: DC
  Administered 2020-04-30 – 2020-05-02 (×3): 10 mg via ORAL
  Filled 2020-04-30 (×3): qty 1

## 2020-04-30 MED ORDER — AMIODARONE HCL IN DEXTROSE 360-4.14 MG/200ML-% IV SOLN: 30.0000 mg/h | INTRAVENOUS | Status: DC

## 2020-04-30 MED ORDER — DIPHENHYDRAMINE HCL 12.5 MG/5ML PO ELIX
12.5000 mg | ORAL_SOLUTION | Freq: Once | ORAL | Status: AC
  Administered 2020-04-30: 12.5 mg via ORAL
  Filled 2020-04-30 (×2): qty 5

## 2020-04-30 MED ORDER — AMIODARONE HCL IN DEXTROSE 360-4.14 MG/200ML-% IV SOLN: 60.0000 mg/h | INTRAVENOUS | Status: DC

## 2020-04-30 NOTE — Progress Notes (Signed)
EPW removed per order.  VSS. Bedrest instructions given.  Will continue to monitor

## 2020-04-30 NOTE — Plan of Care (Signed)
Continue to monitor

## 2020-04-30 NOTE — Research (Signed)
TRAC-AF Registry Informed Consent   Subject Name: Michael Velazquez  Subject met inclusion and exclusion criteria.  The informed consent form, registry requirements and expectations were reviewed with the subject and questions and concerns were addressed prior to the signing of the consent form.  The subject verbalized understanding of the registry requirements.  The subject agreed to participate in the TRAC-AF Registryl and signed the informed consent.  The informed consent was obtained prior to performance of any protocol-specific procedures for the subject.  A copy of the signed informed consent was given to the subject and a copy was placed in the subject's medical record.  [name] 04/30/2020, 4:34 PM

## 2020-04-30 NOTE — Progress Notes (Signed)
Mobility Specialist - Progress Note   04/30/20 1528  Mobility  Activity Ambulated in hall  Level of Assistance Modified independent, requires aide device or extra time  Assistive Device Front wheel walker  Distance Ambulated (ft) 540 ft  Mobility Response Tolerated well  Mobility performed by Mobility specialist  $Mobility charge 1 Mobility     Pre-mobility: 97 HR During mobility: 125 HR Post-mobility: 100 HR  Pt asx during ambulation.  Pricilla Handler Mobility Specialist Mobility Specialist Phone: [phone]

## 2020-04-30 NOTE — Progress Notes (Signed)
CARDIAC REHAB PHASE I   PRE:  Rate/Rhythm: 106 afib  BP:  Supine: 152/98  Sitting:   Standing:    SaO2: 97%RA  MODE:  Ambulation: 540 ft   POST:  Rate/Rhythm: 124 afib  BP:  Supine:   Sitting: 167/77  Standing:    SaO2: 99%RA 0845-0910 Pt walked 540 ft on RA with rolling walker and steady gait. Reinforced sternal precautions. To recliner after walk for breakfast. Encouraged more walks today with staff. HR to 124 with walk but 94 with rest.    Graylon Good, RN BSN  04/30/2020 9:09 AM

## 2020-04-30 NOTE — Progress Notes (Addendum)
[address]       [place] [address]             [phone]      5 Days Post-Op Procedure(s) (LRB): CORONARY ARTERY BYPASS GRAFTING (CABG) x 3 (N/A) MAZE (N/A) CLIPPING OF ATRIAL APPENDAGE (Left) TRANSESOPHAGEAL ECHOCARDIOGRAM (TEE) (N/A) Subjective: Doing well, in  afib with occas RVR  Objective: Vital signs in last 24 hours: Temp:  [98 F (36.7 C)-98.7 F (37.1 C)] 98 F (36.7 C) (10/05 0502) Pulse Rate:  [72-94] 94 (10/05 0502) Cardiac Rhythm: Atrial fibrillation;Bundle branch block (10/05 0451) Resp:  [14-20] 20 (10/05 0502) BP: (120-162)/(68-79) 151/78 (10/05 0502) SpO2:  [95 %-97 %] 96 % (10/05 0502) Weight:  [75.3 kg] 75.3 kg (10/05 0500)  Hemodynamic parameters for last 24 hours:    Intake/Output from previous day: 10/04 0701 - 10/05 0700 In: -  Out: 775 [Urine:775] Intake/Output this shift: No intake/output data recorded.  General appearance: alert, cooperative and no distress Heart: irregularly irregular rhythm Lungs: mildly dim in bases Abdomen: benign Extremities: + edema Wound: incis healing well  Lab Results: Recent Labs    04/29/20 0117 04/30/20 0104  WBC 16.3* 12.7*  HGB 9.5* 9.2*  HCT 27.4* 26.8*  PLT 178 211   BMET:  Recent Labs    04/29/20 0117 04/30/20 0104  NA 132* 134*  K 4.0 3.8  CL 99 99  CO2 25 25  GLUCOSE 91 174*  BUN 37* 33*  CREATININE 1.57* 1.42*  CALCIUM 7.8* 8.0*    PT/INR: No results for input(s): LABPROT, INR in the last 72 hours. ABG    Component Value Date/Time   PHART 7.417 04/25/2020 2125   HCO3 23.7 04/25/2020 2125   TCO2 25 04/25/2020 2125   ACIDBASEDEF 1.0 04/25/2020 2125   O2SAT 98.0 04/25/2020 2125   CBG (last 3)  Recent Labs    04/29/20 1726 04/29/20 2204 04/30/20 0642  GLUCAP 186* 168* 182*    Meds Scheduled Meds: . acetaminophen  1,000 mg Oral Q6H  . amiodarone  200 mg Oral BID  . aspirin EC  325 mg Oral Daily  . atorvastatin  80 mg Oral q1800  .  bisacodyl  10 mg Oral Daily  . chlorhexidine gluconate (MEDLINE KIT)  15 mL Mouth Rinse BID  . Chlorhexidine Gluconate Cloth  6 each Topical Daily  . docusate sodium  200 mg Oral Daily  . enoxaparin (LOVENOX) injection  30 mg Subcutaneous Daily  . furosemide  40 mg Oral Daily  . insulin aspart  0-15 Units Subcutaneous TID WC  . pantoprazole  40 mg Oral Daily  . sodium chloride flush  3 mL Intravenous Q12H   Continuous Infusions: . sodium chloride     PRN Meds:.sodium chloride, alum & mag hydroxide-simeth, alum & mag hydroxide-simeth, lactulose, magnesium hydroxide, ondansetron (ZOFRAN) IV, oxyCODONE, sodium chloride flush, traMADol  Xrays No results found.  Assessment/Plan: S/P Procedure(s) (LRB): CORONARY ARTERY BYPASS GRAFTING (CABG) x 3 (N/A) MAZE (N/A) CLIPPING OF ATRIAL APPENDAGE (Left) TRANSESOPHAGEAL ECHOCARDIOGRAM (TEE) (N/A)  1 conts to progress well overall 2 afib with occas rate in 120's , could consider beta blocker but had earlier brady, cont current amio dose. Some HTN, will restart norvasc. Hold ACE-I/ARB with increased creat. Creat is trending down(1.42) 3 sats good on RA 4 cont lasix for volume overload, may need to consider increase 5 leukocytosis trend improving 6 H/H fairlly stable acute blood loss anemia- monitor 7 BS fair control, variable,  hopefully can restart metformin soon with improving creat trend 8 prob d/c epw's if MD agrees and start eliquis   LOS: 6 days    Michael Giovanni PA-C pager [phone] 04/30/2020  WBC down to normal  start iv amiodarone w/o bolus and cont po amio for chemical cardioversion DC EPWs today- Eliquis tomorrow  patient examined and medical record reviewed,agree with above note. [name] 04/30/2020

## 2020-05-01 LAB — GLUCOSE, CAPILLARY
Glucose-Capillary: 182 mg/dL — ABNORMAL HIGH (ref 70–99)
Glucose-Capillary: 235 mg/dL — ABNORMAL HIGH (ref 70–99)
Glucose-Capillary: 156 mg/dL — ABNORMAL HIGH (ref 70–99)
Glucose-Capillary: 154 mg/dL — ABNORMAL HIGH (ref 70–99)

## 2020-05-01 LAB — BASIC METABOLIC PANEL
Anion gap: 9 (ref 5–15)
BUN: 21 mg/dL (ref 8–23)
CO2: 27 mmol/L (ref 22–32)
Calcium: 8.1 mg/dL — ABNORMAL LOW (ref 8.9–10.3)
Chloride: 98 mmol/L (ref 98–111)
Creatinine, Ser: 1.18 mg/dL (ref 0.61–1.24)
GFR calc non Af Amer: >60 mL/min (ref >60)
Glucose, Bld: 190 mg/dL — ABNORMAL HIGH (ref 70–99)
Potassium: 4.3 mmol/L (ref 3.5–5.1)
Sodium: 134 mmol/L — ABNORMAL LOW (ref 135–145)

## 2020-05-01 LAB — CBC
HCT: 28 % — ABNORMAL LOW (ref 39.0–52.0)
Hemoglobin: 9.3 g/dL — ABNORMAL LOW (ref 13.0–17.0)
MCH: 30.2 pg (ref 26.0–34.0)
MCHC: 33.2 g/dL (ref 30.0–36.0)
MCV: 90.9 fL (ref 80.0–100.0)
Platelets: 246 10*3/uL (ref 150–400)
RBC: 3.08 MIL/uL — ABNORMAL LOW (ref 4.22–5.81)
RDW: 13 % (ref 11.5–15.5)
WBC: 11.2 10*3/uL — ABNORMAL HIGH (ref 4.0–10.5)
nRBC: 0 % (ref 0.0–0.2)

## 2020-05-01 MED ORDER — LISINOPRIL 10 MG PO TABS
20.0000 mg | ORAL_TABLET | Freq: Every day | ORAL | Status: DC
  Administered 2020-05-01: 20 mg via ORAL
  Filled 2020-05-01: qty 2

## 2020-05-01 MED ORDER — ASPIRIN EC 81 MG PO TBEC
81.0000 mg | DELAYED_RELEASE_TABLET | Freq: Every day | ORAL | Status: DC
  Administered 2020-05-01 – 2020-05-02 (×2): 81 mg via ORAL
  Filled 2020-05-01 (×2): qty 1

## 2020-05-01 MED ORDER — METOLAZONE 5 MG PO TABS
5.0000 mg | ORAL_TABLET | Freq: Once | ORAL | Status: AC
  Administered 2020-05-01: 5 mg via ORAL
  Filled 2020-05-01: qty 1

## 2020-05-01 MED ORDER — APIXABAN 5 MG PO TABS
5.0000 mg | ORAL_TABLET | Freq: Two times a day (BID) | ORAL | Status: DC
  Administered 2020-05-01 – 2020-05-02 (×3): 5 mg via ORAL
  Filled 2020-05-01 (×3): qty 1

## 2020-05-01 MED ORDER — FUROSEMIDE 10 MG/ML IJ SOLN
40.0000 mg | Freq: Every day | INTRAMUSCULAR | Status: DC
  Administered 2020-05-01 – 2020-05-02 (×2): 40 mg via INTRAVENOUS
  Filled 2020-05-01 (×2): qty 4

## 2020-05-01 MED ORDER — METFORMIN HCL 500 MG PO TABS
1000.0000 mg | ORAL_TABLET | Freq: Every day | ORAL | Status: DC
  Administered 2020-05-01 – 2020-05-02 (×2): 1000 mg via ORAL
  Filled 2020-05-01 (×2): qty 2

## 2020-05-01 NOTE — Progress Notes (Signed)
CARDIAC REHAB PHASE I   PRE:  Rate/Rhythm: 92 SR BBB  BP:  Supine:   Sitting: 165/81  Standing:    SaO2: 96%RA  MODE:  Ambulation: 800 ft   POST:  Rate/Rhythm: 109 ST BBB  BP:  Supine:   Sitting: 172/79  Standing:    SaO2: 97%RA 1043-1145 Pt walked 800 ft on RA with rolling walker and minimal asst. Would recommend rolling walker for home use. Notified pt's RN. Gait steady. Tolerated well. Education completed with pt and wife who voiced understanding. Reviewed staying in the tube and sternal precautions, encouraged IS, walking for ex and gave diabetic and heart healthy diets. Discussed wound care. Discussed CRP 2 and referred to [place]. Wife would like to be contacted by her phone number as pt sometimes not available.[phone]. Wrote down how to view discharge video.  Graylon Good, RN BSN  05/01/2020 11:41 AM

## 2020-05-01 NOTE — Progress Notes (Addendum)
[address]       [place] [address]             [phone]      6 Days Post-Op Procedure(s) (LRB): CORONARY ARTERY BYPASS GRAFTING (CABG) x 3 (N/A) MAZE (N/A) CLIPPING OF ATRIAL APPENDAGE (Left) TRANSESOPHAGEAL ECHOCARDIOGRAM (TEE) (N/A) Subjective: Looks and feels well, has had afib with some RVR but currently in sinus rhythm  Objective: Vital signs in last 24 hours: Temp:  [97.7 F (36.5 C)-98.5 F (36.9 C)] 98.4 F (36.9 C) (10/06 0416) Pulse Rate:  [70-94] 88 (10/06 0416) Cardiac Rhythm: Atrial fibrillation;Bundle branch block (10/05 1900) Resp:  [18-22] 20 (10/06 0416) BP: (136-176)/(69-96) 164/80 (10/06 0416) SpO2:  [93 %-100 %] 100 % (10/06 0416) Weight:  [76 kg] 76 kg (10/06 0416)  Hemodynamic parameters for last 24 hours:    Intake/Output from previous day: 10/05 0701 - 10/06 0700 In: -  Out: 1075 [Urine:1075] Intake/Output this shift: No intake/output data recorded.  General appearance: alert, cooperative and no distress Heart: regular rate and rhythm Lungs: dim in bases Abdomen: benign Extremities: + bilat LE pitting edema Wound: incis healing well  Lab Results: Recent Labs    04/30/20 0104 05/01/20 0526  WBC 12.7* 11.2*  HGB 9.2* 9.3*  HCT 26.8* 28.0*  PLT 211 246   BMET:  Recent Labs    04/30/20 0104 05/01/20 0526  NA 134* 134*  K 3.8 4.3  CL 99 98  CO2 25 27  GLUCOSE 174* 190*  BUN 33* 21  CREATININE 1.42* 1.18  CALCIUM 8.0* 8.1*    PT/INR: No results for input(s): LABPROT, INR in the last 72 hours. ABG    Component Value Date/Time   PHART 7.417 04/25/2020 2125   HCO3 23.7 04/25/2020 2125   TCO2 25 04/25/2020 2125   ACIDBASEDEF 1.0 04/25/2020 2125   O2SAT 98.0 04/25/2020 2125   CBG (last 3)  Recent Labs    04/30/20 1615 04/30/20 2146 05/01/20 0625  GLUCAP 224* 165* 182*    Meds Scheduled Meds: . amiodarone  200 mg Oral BID  . amLODipine  10 mg Oral Daily  . aspirin EC  325 mg Oral  Daily  . atorvastatin  80 mg Oral q1800  . bisacodyl  10 mg Oral Daily  . chlorhexidine gluconate (MEDLINE KIT)  15 mL Mouth Rinse BID  . Chlorhexidine Gluconate Cloth  6 each Topical Daily  . docusate sodium  200 mg Oral Daily  . furosemide  40 mg Oral Daily  . insulin aspart  0-15 Units Subcutaneous TID WC  . pantoprazole  40 mg Oral Daily  . sodium chloride flush  3 mL Intravenous Q12H   Continuous Infusions: . sodium chloride    . amiodarone 30 mg/hr (04/30/20 2150)   PRN Meds:.sodium chloride, alum & mag hydroxide-simeth, lactulose, magnesium hydroxide, ondansetron (ZOFRAN) IV, oxyCODONE, sodium chloride flush, traMADol  Xrays DG Chest 2 View  Result Date: 04/30/2020 CLINICAL DATA:  History of CABG. EXAM: CHEST - 2 VIEW COMPARISON:  04/28/2020 FINDINGS: 0600 hours. Low volume film. Persistent bibasilar collapse/consolidative opacity. No pulmonary edema. The visualized bony structures of the thorax show no acute abnormality. Telemetry leads overlie the chest. IMPRESSION: Persistent bibasilar collapse/consolidation. Electronically Signed   By: Misty Stanley M.D.   On: 04/30/2020 07:32    Assessment/Plan: S/P Procedure(s) (LRB): CORONARY ARTERY BYPASS GRAFTING (CABG) x 3 (N/A) MAZE (N/A) CLIPPING OF ATRIAL APPENDAGE (Left) TRANSESOPHAGEAL ECHOCARDIOGRAM (TEE) (N/A)  1 conts to  do well 2 afib, currently sinus rhythm- poss stop IV amio today, start eliquis 3 HTN- renal fxn is now normal, good UOP but weight still up and has pitting edema, cont lasix and start ARB 4 sats good on RA 5 anemia is improved with volume equalibration 6 BS control is fair- restart metformin, resume others at d/c if creat ok tomorrow 7 hopefully home in am, routine pulm toilet and rehab  LOS: 7 days    John Giovanni PA-C Pager [phone] 05/01/2020 Patient needs IV diuresis for volume overload and observation for 24 hours off IV amiodarone before ready for discharge tomorrow Discharge instructions  reviewed with the patient. Titrate meds for blood pressure control   patient examined and medical record reviewed,agree with above note. [name] 05/01/2020

## 2020-05-01 NOTE — Plan of Care (Signed)
  Problem: Health Behavior/Discharge Planning: Goal: Ability to manage health-related needs will improve Outcome: Progressing   Problem: Activity: Goal: Risk for activity intolerance will decrease Outcome: Progressing   

## 2020-05-01 NOTE — Progress Notes (Signed)
Mobility Specialist - Progress Note   05/01/20 1356  Mobility  Activity Ambulated in hall  Level of Assistance Modified independent, requires aide device or extra time  Assistive Device Front wheel walker  Distance Ambulated (ft) 540 ft  Mobility Response Tolerated well  Mobility performed by Mobility specialist  $Mobility charge 1 Mobility    Pre-mobility: 105 HR During mobility: 110 HR Post-mobility: 107 HR  Pt asx throughout ambulation. He states he'd like to try walking w/o a RW tomorrow.    Pricilla Handler Mobility Specialist Mobility Specialist Phone: [phone]

## 2020-05-01 NOTE — TOC Benefit Eligibility Note (Signed)
Transition of Care [facility]) Benefit Eligibility Note    Patient Details  Name: AMOR PACKARD MRN: [id number] Date of Birth: [birth year]   Medication/Dose: Eliquis 5mg  bid  Covered?: Yes     Prescription Coverage Preferred Pharmacy: any retail pharmacy (co-pay may vary slightly)  Spoke with Person/Company/Phone Number:: [employer]  Co-Pay: $133.06 for 30 day retail  Prior Approval: No     Additional Notes: patient is in coverage gap    [employer] Phone Number: 05/01/2020, 2:02 PM

## 2020-05-01 NOTE — Progress Notes (Signed)
Inpatient Diabetes Program Recommendations  AACE/ADA: New Consensus Statement on Inpatient Glycemic Control (2015)  Target Ranges:  Prepandial:   less than 140 mg/dL      Peak postprandial:   less than 180 mg/dL (1-2 hours)      Critically ill patients:  140 - 180 mg/dL   Lab Results  Component Value Date   GLUCAP 182 (H) 05/01/2020   HGBA1C 8.1 (H) 04/25/2020    Review of Glycemic Control Results for VANCE, HOCHMUTH" (MRN [id number]) as of 05/01/2020 09:44  Ref. Range 04/30/2020 06:42 04/30/2020 11:22 04/30/2020 16:15 04/30/2020 21:46 05/01/2020 06:25  Glucose-Capillary Latest Ref Range: 70 - 99 mg/dL 182 (H) 290 (H) 224 (H) 165 (H) 182 (H)     Inpatient Diabetes Program Recommendations:     Novolog 2-3 units tid with meals if postprandials remain elevated  Will continue to follow while inpatient.  Thank you, Reche Dixon, RN, BSN Diabetes Coordinator Inpatient Diabetes Program [phone] (team pager from 8a-5p)

## 2020-05-01 NOTE — Discharge Instructions (Signed)
Discharge Instructions:  1. You may shower, please wash incisions daily with soap and water and keep dry.  If you wish to cover wounds with dressing you may do so but please keep clean and change daily.  No tub baths or swimming until incisions have completely healed.  If your incisions become red or develop any drainage please call our office at [phone]  2. No Driving until cleared by Dr. Lucianne Lei Trigt's office and you are no longer using narcotic pain medications  3. Monitor your weight daily.. Please use the same scale and weigh at same time... If you gain 5-10 lbs in 48 hours with associated lower extremity swelling, please contact our office at [phone]  4. Fever of 101.5 for at least 24 hours with no source, please contact our office at [phone]  5. Activity- up as tolerated, please walk at least 3 times per day.  Avoid strenuous activity, no lifting, pushing, or pulling with your arms over 8-10 lbs for a minimum of 6 weeks  6. If any questions or concerns arise, please do not hesitate to contact our office at [phone]  Information on my medicine - ELIQUIS (apixaban)  This medication education was reviewed with me or my healthcare representative as part of my discharge preparation.  The pharmacist that spoke with me during my hospital stay was:  Einar Grad, [facility]  Why was Eliquis prescribed for you? Eliquis was prescribed for you to reduce the risk of a blood clot forming that can cause a stroke if you have a medical condition called atrial fibrillation (a type of irregular heartbeat).  What do You need to know about Eliquis ? Take your Eliquis TWICE DAILY - one tablet in the morning and one tablet in the evening with or without food. If you have difficulty swallowing the tablet whole please discuss with your pharmacist how to take the medication safely.  Take Eliquis exactly as prescribed by your doctor and DO NOT stop taking Eliquis without talking to the  doctor who prescribed the medication.  Stopping may increase your risk of developing a stroke.  Refill your prescription before you run out.  After discharge, you should have regular check-up appointments with your healthcare provider that is prescribing your Eliquis.  In the future your dose may need to be changed if your kidney function or weight changes by a significant amount or as you get older.  What do you do if you miss a dose? If you miss a dose, take it as soon as you remember on the same day and resume taking twice daily.  Do not take more than one dose of ELIQUIS at the same time to make up a missed dose.  Important Safety Information A possible side effect of Eliquis is bleeding. You should call your healthcare provider right away if you experience any of the following: ? Bleeding from an injury or your nose that does not stop. ? Unusual colored urine (red or dark brown) or unusual colored stools (red or black). ? Unusual bruising for unknown reasons. ? A serious fall or if you hit your head (even if there is no bleeding).  Some medicines may interact with Eliquis and might increase your risk of bleeding or clotting while on Eliquis. To help avoid this, consult your healthcare provider or pharmacist prior to using any new prescription or non-prescription medications, including herbals, vitamins, non-steroidal anti-inflammatory drugs (NSAIDs) and supplements.  This website has more information on Eliquis (apixaban): http://www.eliquis.com/eliquis/home

## 2020-05-01 NOTE — Progress Notes (Signed)
Patient assisted x1 with nursing staff to chair. Patient ordering breakfast. Will monitor. [name], Bettina Gavia RN

## 2020-05-02 ENCOUNTER — Other Ambulatory Visit: Payer: Self-pay

## 2020-05-02 LAB — GLUCOSE, CAPILLARY: Glucose-Capillary: 168 mg/dL — ABNORMAL HIGH (ref 70–99)

## 2020-05-02 LAB — BASIC METABOLIC PANEL
Anion gap: 10 (ref 5–15)
BUN: 17 mg/dL (ref 8–23)
CO2: 27 mmol/L (ref 22–32)
Calcium: 7.9 mg/dL — ABNORMAL LOW (ref 8.9–10.3)
Chloride: 96 mmol/L — ABNORMAL LOW (ref 98–111)
Creatinine, Ser: 1.19 mg/dL (ref 0.61–1.24)
GFR calc non Af Amer: >60 mL/min (ref >60)
Glucose, Bld: 151 mg/dL — ABNORMAL HIGH (ref 70–99)
Potassium: 3.6 mmol/L (ref 3.5–5.1)
Sodium: 133 mmol/L — ABNORMAL LOW (ref 135–145)

## 2020-05-02 MED ORDER — FUROSEMIDE 40 MG PO TABS: 40.0000 mg | ORAL_TABLET | Freq: Every day | ORAL | 0 refills | Status: DC

## 2020-05-02 MED ORDER — FUROSEMIDE 40 MG PO TABS: 40.0000 mg | ORAL_TABLET | Freq: Every day | ORAL | Status: DC

## 2020-05-02 MED ORDER — APIXABAN 5 MG PO TABS: 5.0000 mg | ORAL_TABLET | Freq: Two times a day (BID) | ORAL | 0 refills | Status: AC

## 2020-05-02 MED ORDER — METOPROLOL TARTRATE 5 MG/5ML IV SOLN
INTRAVENOUS | Status: AC
  Filled 2020-05-02: qty 5

## 2020-05-02 MED ORDER — LISINOPRIL 40 MG PO TABS: 40.0000 mg | ORAL_TABLET | Freq: Every day | ORAL | Status: DC

## 2020-05-02 MED ORDER — METOPROLOL TARTRATE 25 MG PO TABS
25.0000 mg | ORAL_TABLET | Freq: Two times a day (BID) | ORAL | Status: DC
  Administered 2020-05-02: 25 mg via ORAL
  Filled 2020-05-02: qty 1

## 2020-05-02 MED ORDER — AMIODARONE HCL 200 MG PO TABS: 200.0000 mg | ORAL_TABLET | Freq: Two times a day (BID) | ORAL | 3 refills | Status: DC

## 2020-05-02 MED ORDER — LISINOPRIL 10 MG PO TABS
20.0000 mg | ORAL_TABLET | Freq: Every day | ORAL | Status: DC
  Administered 2020-05-02: 20 mg via ORAL
  Filled 2020-05-02: qty 2

## 2020-05-02 MED ORDER — METOPROLOL TARTRATE 25 MG PO TABS
25.0000 mg | ORAL_TABLET | Freq: Two times a day (BID) | ORAL | 3 refills | Status: DC
Start: 2020-05-02 — End: 2020-05-21

## 2020-05-02 MED ORDER — METOPROLOL TARTRATE 25 MG PO TABS
25.0000 mg | ORAL_TABLET | Freq: Two times a day (BID) | ORAL | 3 refills | Status: DC
Start: 2020-05-02 — End: 2020-05-02

## 2020-05-02 MED ORDER — METOPROLOL TARTRATE 5 MG/5ML IV SOLN
5.0000 mg | Freq: Once | INTRAVENOUS | Status: AC
  Administered 2020-05-02: 5 mg via INTRAVENOUS

## 2020-05-02 MED ORDER — TRAMADOL HCL 50 MG PO TABS: 50.0000 mg | ORAL_TABLET | ORAL | 0 refills | Status: DC | PRN

## 2020-05-02 NOTE — Progress Notes (Signed)
Removed three sutures anterior mid chest and one suture right groin. Steri strips applied. No drainage. Patient tolerated well.  Daymon Larsen, RN

## 2020-05-02 NOTE — Progress Notes (Addendum)
      [address]       Michael Velaz[place] [address]             [phone]      7 Days Post-Op Procedure(s) (LRB): CORONARY ARTERY BYPASS GRAFTING (CABG) x 3 (N/A) MAZE (N/A) CLIPPING OF ATRIAL APPENDAGE (Left) TRANSESOPHAGEAL ECHOCARDIOGRAM (TEE) (N/A)   Subjective:  No new complaints. Feels pretty good.  Spoke with patient about Eliquis copay.  He is going to check with his wife and see if they are able to afford this.    Objective: Vital signs in last 24 hours: Temp:  [97.5 F (36.4 C)-98.4 F (36.9 C)] 97.5 F (36.4 C) (10/07 0540) Pulse Rate:  [72-97] 97 (10/07 0540) Cardiac Rhythm: Normal sinus rhythm;Bundle branch block (10/06 1901) Resp:  [19-20] 19 (10/07 0540) BP: (143-174)/(72-82) 154/76 (10/07 0540) SpO2:  [93 %-98 %] 96 % (10/07 0540) Weight:  [74.5 kg] 74.5 kg (10/07 0500)  Intake/Output from previous day: 10/06 0701 - 10/07 0700 In: 610.1 [P.O.:240; I.V.:370.1] Out: 2420 [Urine:2420]  General appearance: alert, cooperative and no distress Heart: irregularly irregular rhythm Lungs: clear to auscultation bilaterally Abdomen: soft, non-tender; bowel sounds normal; no masses,  no organomegaly Extremities: edema trace Wound: clean and dry  Lab Results: Recent Labs    04/30/20 0104 05/01/20 0526  WBC 12.7* 11.2*  HGB 9.2* 9.3*  HCT 26.8* 28.0*  PLT 211 246   BMET:  Recent Labs    05/01/20 0526 05/02/20 0322  NA 134* 133*  K 4.3 3.6  CL 98 96*  CO2 27 27  GLUCOSE 190* 151*  BUN 21 17  CREATININE 1.18 1.19  CALCIUM 8.1* 7.9*    PT/INR: No results for input(s): LABPROT, INR in the last 72 hours. ABG    Component Value Date/Time   PHART 7.417 04/25/2020 2125   HCO3 23.7 04/25/2020 2125   TCO2 25 04/25/2020 2125   ACIDBASEDEF 1.0 04/25/2020 2125   O2SAT 98.0 04/25/2020 2125   CBG (last 3)  Recent Labs    05/01/20 1130 05/01/20 1610 05/01/20 2155  GLUCAP 235* 156* 154*    Assessment/Plan: S/P Procedure(s)  (LRB): CORONARY ARTERY BYPASS GRAFTING (CABG) x 3 (N/A) MAZE (N/A) CLIPPING OF ATRIAL APPENDAGE (Left) TRANSESOPHAGEAL ECHOCARDIOGRAM (TEE) (N/A)  1. CV- back in rate controlled A. Fib, remains Hypertensive-continue Amiodarone, Norvasc, Lisinopril, will add Lopressor 25 mg BID 2. Pulm- no acute issues, continue IS 3. Renal- creatinine at 1.19, weight is trending down, continue Lasix 4. DM- sugars well controlled, continue diabetic medications 5. Dispo- patient stable, will start Lopressor at 25 mg BID, for additional HR and BP control, converted back into A. Fib rate is controlled, patient will discuss with wife if they can afford Eliquis if not able, will need to transition to coumadin   LOS: 8 days    Michael Handler, PA-C 05/02/2020   DC on Eliquis if he returns to nsr with a dose of iv metoprolol and po med changes DC instructions d/w patient  patient examined and medical record reviewed,agree with above note. Michael Velazquez 05/02/2020

## 2020-05-02 NOTE — Progress Notes (Signed)
CARDIAC REHAB PHASE I   PRE:  Rate/Rhythm: 79 afib BBB  BP:  Supine:   Sitting: 123/70  Standing:    SaO2: 96%RA  MODE:  Ambulation: 600 ft   POST:  Rate/Rhythm: 107 afib BBB  BP:  Supine:   Sitting: 127/78  Standing:    SaO2: 96%RA 1610-9604 Waited for pt to finish breakfast. Pt walked 600 ft on RA with rolling walker with steady gait. Reinforced sternal precautions when standing. Has rolling walker for home use in room. Back to recliner with call bell. Remained in atrial fib.   Graylon Good, RN BSN  05/02/2020 10:19 AM

## 2020-05-03 ENCOUNTER — Other Ambulatory Visit: Payer: Self-pay | Admitting: *Deleted

## 2020-05-03 ENCOUNTER — Telehealth: Payer: Self-pay | Admitting: *Deleted

## 2020-05-03 NOTE — Telephone Encounter (Signed)
Script for strips and lancets written out and on provider door for signature. Will fax. Pt wife informed and verbalized understanding.

## 2020-05-03 NOTE — Telephone Encounter (Signed)
Patient wife called stating patient was discharged from the hospital yesterday. He Is still taking Januvia and metformin but is needing to keep a check on his sugars. Wife reports the only glucose monitor they could get right away without rx was the trumetrix. Wife and patient are wanting Dr. Bary Leriche opinion on the [place] and wanting to know how that works. They are going to need rx for meter and supplies. Please advise.

## 2020-05-03 NOTE — Telephone Encounter (Signed)
Mrs. Monestime called in regards to her husband Mr. Class. Pt was d/c home yesterday s/p CABG 04/25/20. Wife was checking medications and noticed she received 3 doses of k+cler 64meq which was not on his d/c summary of medications. Verified with wife that that was not on pt's d/c list of medications for patient to take. Wife was also concerned about pt's blood sugar stating his post breakfast sugar was 240. Pt is on metformin. Advised her to continue to check his sugars & for him to continue taking his medication as prescribed. All concerns addressed during call.

## 2020-05-03 NOTE — Telephone Encounter (Signed)
Please go ahead and provide a prescription for glucometer strips and supplies may check sugars once or twice daily because of fluctuating sugars As for the continuous monitoring that is typically indicated for individuals who are on several shots of insulin a day For individuals who are not on frequent insulin most people just pricked their finger. There is a cost difference between the 2 as well The continuous monitoring is generally approved for individuals who are on frequent insulin Many insurance companies do not approve continuous monitoring or pay for continuous monitoring in situations where they are not on frequent insulin But the patient can check with their insurance company and if it is covered they can also find out what their co-pay is and if it is affordable let us know and we can send in prescription for this

## 2020-05-06 ENCOUNTER — Telehealth: Payer: Self-pay

## 2020-05-06 ENCOUNTER — Encounter: Payer: Self-pay | Admitting: Family Medicine

## 2020-05-06 NOTE — Telephone Encounter (Signed)
Patient's wife, Benjamine Mola contacted the office to state that patient was just discharged from the hospital, s/p CABG/ MAZE 04/25/20 and he did not get all of the medications that were to be sent into his pharmacy.  She stated that he was missing his Lasix and Lopressor.  Per Nicholes Rough, PA prescriptions would be called into [facility] per patient's wife's request.

## 2020-05-08 ENCOUNTER — Other Ambulatory Visit: Payer: Self-pay | Admitting: Family Medicine

## 2020-05-08 MED ORDER — LANTUS SOLOSTAR 100 UNIT/ML ~~LOC~~ SOPN: PEN_INJECTOR | SUBCUTANEOUS | 4 refills | Status: AC

## 2020-05-08 NOTE — Telephone Encounter (Signed)
Nurses I did speak with Beth regarding how [facility] is doing Glucoses are significantly elevated. I recommended the following  Lantus 6 units each evening may titrate up to 30 units upon our direction Please send in prescription for a Lantus pen Solostar with four refills to [facility] drug along with necessary supplies such as needles for the Lantus pen  She will follow his sugar readings and send [place] updates  As for the constipation I advised her to use Colace one a day May also use MiraLAX one half capful in 6 oz. of water twice daily The goal is to have soft bowel movements May need to do enema this was discussed with Beth She will give [place] an update in several days Soon[place] if she needs [place]  Please send in the prescription Please send a copy of this to [facility] via [employer]

## 2020-05-08 NOTE — Addendum Note (Signed)
Addended by: Vicente Males on: 05/08/2020 10:56 AM   Modules accepted: Orders

## 2020-05-14 ENCOUNTER — Encounter: Payer: Self-pay | Admitting: Family Medicine

## 2020-05-15 ENCOUNTER — Other Ambulatory Visit: Payer: Self-pay | Admitting: *Deleted

## 2020-05-15 ENCOUNTER — Telehealth: Payer: Self-pay | Admitting: Cardiology

## 2020-05-15 ENCOUNTER — Other Ambulatory Visit: Payer: Self-pay | Admitting: Family Medicine

## 2020-05-15 MED ORDER — AMLODIPINE BESYLATE 5 MG PO TABS: 5.0000 mg | ORAL_TABLET | Freq: Every day | ORAL | 5 refills | Status: AC

## 2020-05-15 NOTE — Telephone Encounter (Signed)
Yes, would keep scheduled visit.

## 2020-05-15 NOTE — Telephone Encounter (Signed)
Will fwd to provider for answer.

## 2020-05-15 NOTE — Telephone Encounter (Signed)
Nurses Please reduce his amlodipine Recommend 5 mg 1 daily, #30, 5 refills, discontinue 10 mg amlodipine  Family should send [place] update within 4 to 5 days thank you

## 2020-05-15 NOTE — Telephone Encounter (Signed)
Pt aware.

## 2020-05-15 NOTE — Telephone Encounter (Signed)
Per phone call from pt's wife Benjamine Mola-- would like to know if pt needs to keep his apt w/ SM on 10/26 since he's seeing Dr. Lawson Fiscal on 11/3   Please call [phone]

## 2020-05-17 ENCOUNTER — Encounter: Payer: Self-pay | Admitting: Family Medicine

## 2020-05-17 ENCOUNTER — Telehealth: Payer: Self-pay | Admitting: *Deleted

## 2020-05-17 ENCOUNTER — Other Ambulatory Visit: Payer: Self-pay | Admitting: *Deleted

## 2020-05-17 MED ORDER — CLONAZEPAM 0.5 MG PO TABS: ORAL_TABLET | ORAL | 0 refills | Status: AC

## 2020-05-17 NOTE — Telephone Encounter (Signed)
Nurses Please communicate with his wife-Beth. She is the caretaker.  Please go ahead and pend Klonopin 0.5 mg 1 nightly as needed insomnia, #30-I will sign if routed to me under refills or phone messages thank you  Also may add MiraLAX one half capful with 6 ounces of water daily. Over the next several days if that does not soften up bowel movements enough then may increase it to a full capful in 8 ounces of water  Give [place] follow-up early next week thanks-Dr. Nicki Reaper

## 2020-05-17 NOTE — Telephone Encounter (Signed)
Med pended.      Nurses Please communicate with his wife-Michael Velazquez. She is the caretaker.  Please go ahead and pend Klonopin 0.5 mg 1 nightly as needed insomnia, #30-I will sign if routed to me under refills or phone messages thank you  Also may add MiraLAX one half capful with 6 ounces of water daily. Over the next several days if that does not soften up bowel movements enough then may increase it to a full capful in 8 ounces of water  Give [place] follow-up Michael Velazquez next week thanks-Dr. Nicki Reaper

## 2020-05-17 NOTE — Telephone Encounter (Signed)
Mrs. Alyn Jurney called in regards to Mr. Montanari stating he has not had a bowel movement in 4 days. She states he is taking Colace daily without much success but states he is passing gas and belching. I recommended Miralax to her in addition to the Colace. I advised her to encourage Mr. Blasco to drink plenty of water throughout the day, eat fiber rich foods, and move as tolerated. She also was concerned about Mr. Fronczak having panic attacks at night stating he gets  anxious about not being able to sleep at night. I encouraged her to reach out to his PCP if she feels he needs intervention. Mrs. Bolds accepts receipt. All questions answered.

## 2020-05-21 ENCOUNTER — Ambulatory Visit (HOSPITAL_COMMUNITY)
Admission: RE | Admit: 2020-05-21 | Discharge: 2020-05-21 | Disposition: A | Discharge status: Home / Self Care | Payer: Medicare Other | Source: Ambulatory Visit | Attending: Cardiology | Admitting: Cardiology

## 2020-05-21 ENCOUNTER — Other Ambulatory Visit: Payer: Self-pay

## 2020-05-21 ENCOUNTER — Ambulatory Visit: Payer: Medicare Other | Admitting: Cardiology

## 2020-05-21 ENCOUNTER — Encounter: Payer: Self-pay | Admitting: Cardiology

## 2020-05-21 VITALS — BP 150/68 | HR 60 | Ht 66.0 in | Wt 174.0 lb

## 2020-05-21 DIAGNOSIS — I25119 Atherosclerotic heart disease of native coronary artery with unspecified angina pectoris: Secondary | ICD-10-CM

## 2020-05-21 DIAGNOSIS — I5033 Acute on chronic diastolic (congestive) heart failure: Secondary | ICD-10-CM | POA: Diagnosis not present

## 2020-05-21 DIAGNOSIS — I9789 Other postprocedural complications and disorders of the circulatory system, not elsewhere classified: Secondary | ICD-10-CM

## 2020-05-21 DIAGNOSIS — I4891 Unspecified atrial fibrillation: Secondary | ICD-10-CM

## 2020-05-21 DIAGNOSIS — R0602 Shortness of breath: Secondary | ICD-10-CM | POA: Insufficient documentation

## 2020-05-21 DIAGNOSIS — J90 Pleural effusion, not elsewhere classified: Secondary | ICD-10-CM | POA: Diagnosis not present

## 2020-05-21 DIAGNOSIS — J9811 Atelectasis: Secondary | ICD-10-CM | POA: Diagnosis not present

## 2020-05-21 DIAGNOSIS — I10 Essential (primary) hypertension: Secondary | ICD-10-CM | POA: Diagnosis not present

## 2020-05-21 MED ORDER — FUROSEMIDE 40 MG PO TABS: 40.0000 mg | ORAL_TABLET | Freq: Every day | ORAL | 3 refills | Status: AC

## 2020-05-21 MED ORDER — POTASSIUM CHLORIDE ER 10 MEQ PO TBCR: 10.0000 meq | EXTENDED_RELEASE_TABLET | Freq: Every day | ORAL | 3 refills | Status: AC

## 2020-05-21 MED ORDER — AMIODARONE HCL 200 MG PO TABS: 200.0000 mg | ORAL_TABLET | Freq: Every day | ORAL | 2 refills | Status: AC

## 2020-05-21 NOTE — Progress Notes (Signed)
Cardiology Office Note  Date: 05/21/2020   ID: Michael Velazquez, Michael Velazquez 08/10/1945, MRN [id number]  PCP:  Kathyrn Drown, MD  Cardiologist:  Rozann Lesches, MD Electrophysiologist:  None   Chief Complaint  Patient presents with  . Hospitalization Follow-up    History of Present Illness: Michael Velazquez is a 74 y.o. male last seen in September.  He presents for post hospital follow-up status post CABG in September per Dr. Prescott Gum including LIMA to LAD, SVG to diagonal, and SVG to first obtuse marginal.  He also underwent Maze procedure and clipping of the left atrial appendage.  Cardiac catheterization data outlined below.  He did develop postoperative atrial fibrillation, initially cardioverted but then returned later in the hospital stay.  He was treated with amiodarone and discharged on Eliquis.  He presents today with his wife for a follow-up visit.  States that he is doing "fair."  Has had trouble with his sleep, intermittent nightmares, also feeling of anxiety at times.  He also reports feeling short of breath, has had swelling in his legs and left hand.  He did take a limited course of Lasix for 3 days after discharge.  He does not report any obvious angina symptoms or palpitations.  I reviewed his current regimen which is outlined below.  I personally reviewed his ECG today which shows sinus bradycardia with prolonged PR interval and right bundle branch block, also left anterior fascicular block.  Past Medical History:  Diagnosis Date  . CAD (coronary artery disease)    Status post CABG September 2021 - LIMA to LAD, SVG to diagonal, SVG to OM1  . Essential hypertension   . Postoperative atrial fibrillation ([place])   . Type 2 diabetes mellitus ([place])     Past Surgical History:  Procedure Laterality Date  . CLIPPING OF ATRIAL APPENDAGE Left 04/25/2020   Procedure: CLIPPING OF ATRIAL APPENDAGE;  Surgeon: Ivin Poot, MD;  Location: [facility];  Service: Open Heart  Surgery;  Laterality: Left;  . CORONARY ARTERY BYPASS GRAFT N/A 04/25/2020   Procedure: CORONARY ARTERY BYPASS GRAFTING (CABG) x 3;  Surgeon: Ivin Poot, MD;  Location: [facility];  Service: Open Heart Surgery;  Laterality: N/A;  . LEFT HEART CATH AND CORONARY ANGIOGRAPHY N/A 04/24/2020   Procedure: LEFT HEART CATH AND CORONARY ANGIOGRAPHY;  Surgeon: Belva Crome, MD;  Location: [facility];  Service: Cardiovascular;  Laterality: N/A;  . MAZE N/A 04/25/2020   Procedure: MAZE;  Surgeon: Ivin Poot, MD;  Location: [facility];  Service: Open Heart Surgery;  Laterality: N/A;  . No prior surgery    . TEE WITHOUT CARDIOVERSION N/A 04/25/2020   Procedure: TRANSESOPHAGEAL ECHOCARDIOGRAM (TEE);  Surgeon: Prescott Gum, Collier Salina, MD;  Location: [facility];  Service: Open Heart Surgery;  Laterality: N/A;    Current Outpatient Medications  Medication Sig Dispense Refill  . amiodarone (PACERONE) 200 MG tablet Take 1 tablet (200 mg total) by mouth daily. 90 tablet 2  . amLODipine (NORVASC) 5 MG tablet Take 1 tablet (5 mg total) by mouth daily. 30 tablet 5  . apixaban (ELIQUIS) 5 MG TABS tablet Take 1 tablet (5 mg total) by mouth 2 (two) times daily. 60 tablet 0  . ascorbic acid (VITAMIN C) 500 MG tablet Take 500 mg by mouth every other day.    Marland Kitchen aspirin 81 MG tablet Take 81 mg by mouth daily.    Marland Kitchen atenolol (TENORMIN) 50 MG tablet TAKE 1 TABLET BY MOUTH  TWICE DAILY (Patient taking differently: Take 50 mg by mouth 2 (two) times daily. ) 180 tablet 0  . atorvastatin (LIPITOR) 80 MG tablet Take 1 tablet (80 mg total) by mouth daily. 30 tablet 11  . cholecalciferol (VITAMIN D3) 25 MCG (1000 UNIT) tablet Take 1,000 Units by mouth every other day.    . clonazePAM (KLONOPIN) 0.5 MG tablet Take 1 tablet nightly as needed for insomnia 30 tablet 0  . Emollient (DERMEND BRUISE FORMULA EX) Apply 1 application topically every other day.    Marland Kitchen EPINEPHrine 0.3 mg/0.3 mL IJ SOAJ injection Inject 0.3 mg into the muscle as needed  for anaphylaxis.     Marland Kitchen insulin glargine (LANTUS SOLOSTAR) 100 UNIT/ML Solostar Pen Inject 6 units into skin q evening. May titrate up to 30 units per provider discretion. 15 mL 4  . JANUVIA 100 MG tablet TAKE 1 TABLET BY MOUTH EVERY DAY 30 tablet 1  . lansoprazole (PREVACID) 15 MG capsule Take 15 mg by mouth daily as needed (heartburn).    Marland Kitchen lisinopril-hydrochlorothiazide (ZESTORETIC) 20-25 MG tablet TAKE 1 TABLET BY MOUTH EVERY DAY (Patient taking differently: Take 1 tablet by mouth daily. ) 90 tablet 0  . metFORMIN (GLUCOPHAGE) 1000 MG tablet TAKE 1 TABLET BY MOUTH EVERY DAY (Patient taking differently: Take 1,000 mg by mouth daily. ) 90 tablet 0  . traMADol (ULTRAM) 50 MG tablet Take 1-2 tablets (50-100 mg total) by mouth every 4 (four) hours as needed for moderate pain. 30 tablet 0  . vitamin B-12 (CYANOCOBALAMIN) 1000 MCG tablet Take 1,000 mcg by mouth every other day.    . furosemide (LASIX) 40 MG tablet Take 1 tablet (40 mg total) by mouth daily. 30 tablet 3  . potassium chloride (KLOR-CON) 10 MEQ tablet Take 1 tablet (10 mEq total) by mouth daily. 30 tablet 3   Current Facility-Administered Medications  Medication Dose Route Frequency Provider Last Rate Last Admin  . nitroGLYCERIN (NITROSTAT) SL tablet 0.4 mg  0.4 mg Sublingual Q5 min PRN Belva Crome, MD       Allergies:  Yellow jacket venom [bee venom]   Social History: The patient  reports that he has never smoked. He has never used smokeless tobacco. He reports current alcohol use. He reports that he does not use drugs.   Family History: The patient's family history includes Heart attack in his father; Hyperlipidemia in his mother; Hypertension in his mother.   ROS: No syncope.  Using a walker, no falls.  Physical Exam: VS:  BP (!) 150/68   Pulse 60   Ht 5\' 6"  (1.676 m)   Wt 174 lb (78.9 kg)   SpO2 (!) 89%   BMI 28.08 kg/m , BMI Body mass index is 28.08 kg/m.  Wt Readings from Last 3 Encounters:  05/21/20 174 lb (78.9  kg)  05/02/20 164 lb 3.9 oz (74.5 kg)  04/18/20 156 lb (70.8 kg)    General: Patient appears comfortable at rest.  Using a walker. HEENT: Conjunctiva and lids normal, wearing a mask. Neck: Supple, elevated JVP, no carotid bruits, no thyromegaly. Lungs: Decreased breath sounds at the bases bilaterally, mildly short of breath speaking in sentences. Cardiac: Regular rate and rhythm, no S3, soft systolic murmur, no pericardial rub. Abdomen: Soft, nontender, bowel sounds present. Extremities: 2+ bilateral lower extremity edema. Skin: Warm and dry. Musculoskeletal: No kyphosis. Neuropsychiatric: Alert and oriented x3, affect grossly appropriate.  ECG:  An ECG dated 04/26/2020 was personally reviewed today and demonstrated:  Atrial fibrillation  with right bundle branch block, left anterior fascicular block.  Recent Labwork: 04/24/2020: TSH 1.744 04/25/2020: ALT 20; AST 20 04/26/2020: Magnesium 2.1 05/01/2020: Hemoglobin 9.3; Platelets 246 05/02/2020: BUN 17; Creatinine, Ser 1.19; Potassium 3.6; Sodium 133     Component Value Date/Time   CHOL 143 04/25/2020 0604   CHOL 110 10/05/2018 1137   TRIG 179 (H) 04/25/2020 0604   HDL 30 (L) 04/25/2020 0604   HDL 37 (L) 10/05/2018 1137   CHOLHDL 4.8 04/25/2020 0604   VLDL 36 04/25/2020 0604   LDLCALC 77 04/25/2020 0604   LDLCALC 50 10/05/2018 1137    Other Studies Reviewed Today:  Echocardiogram 04/16/2020: 1. Left ventricular ejection fraction, by estimation, is 60 to 65%. The  left ventricle has normal function. The left ventricle has no regional  wall motion abnormalities. There is mild left ventricular hypertrophy.  Left ventricular diastolic parameters  are indeterminate.  2. Right ventricular systolic function is normal. The right ventricular  size is normal.  3. Left atrial size was mildly dilated.  4. The mitral valve is normal in structure. Mild mitral valve  regurgitation. No evidence of mitral stenosis.  5. The aortic valve is  tricuspid. Aortic valve regurgitation is not  visualized. No aortic stenosis is present.  6. The inferior vena cava is normal in size with greater than 50%  respiratory variability, suggesting right atrial pressure of 3 mmHg.   Cardiac catheterization 04/24/2020:  Conclusion Severe two-vessel coronary disease including 50% calcified distal left main. 95 to 99% ostial LAD, proximal 70%, mid to distal 75 to 80%. Second diagonal 70%. First obtuse marginal, very large, and contains 95% mid vessel stenosis. Continuation of circumflex beyond the first marginal is 70% obstructed. It is followed by a moderate sized second obtuse marginal. The right coronary is large vessel without high-grade obstruction in the proximal and mid vessel. There is eccentric 75% stenosis beyond the origin of the PDA supplying small left ventricular branches. Normal LV function. Normal LVEDP. Estimated ejection fraction 65%. RECOMMENDATIONS: Increase statin therapy intensity to 80 mg/day. Discontinue sildenafil. Discontinue nonsteroidal anti-inflammatory agents. T CTS consultation ASAP as an outpatient. If chest pain is not relieved by sublingual nitroglycerin, go to the emergency room. If chest pain occurs at rest, go to the emergency room.    Severe two-vessel coronary disease including 50% calcified distal left main.  95 to 99% ostial LAD, proximal 70%, mid to distal 75 to 80%.  Second diagonal 70%.  First obtuse marginal, very large, and contains 95% mid vessel stenosis.  Continuation of circumflex beyond the first marginal is 70% obstructed.  It is followed by a moderate sized second obtuse marginal.  The right coronary is large vessel without high-grade obstruction in the proximal and mid vessel.  There is eccentric 75% stenosis beyond the origin of the PDA supplying small left ventricular branches.  Normal LV function.  Normal LVEDP.  Estimated ejection fraction 65%.  Carotid Dopplers 04/24/2020: Summary:  Right  Carotid: Velocities in the right ICA are consistent with a 1-39%  stenosis.   Left Carotid: Velocities in the left ICA are consistent with a 1-39%  stenosis.  Vertebrals: Bilateral vertebral arteries demonstrate antegrade flow.   Assessment and Plan:  1.  Multivessel CAD status post CABG in September including LIMA to LAD, SVG to diagonal, and SVG to first obtuse marginal.  LVEF normal preoperatively and by intraoperative TEE, RV function also normal.  He reports shortness of breath and shows evidence of fluid overload with apparent acute  on chronic diastolic heart failure, likely pleural effusions as well.  We will start him back on Lasix 40 mg daily with KCl 10 mEq daily, sending for PA and lateral chest x-ray today.  He has follow-up with Dr. Prescott Gum next week.  I see that he has been on both Lopressor and atenolol, I asked him to stop the Lopressor.  Otherwise continue low-dose aspirin, Norvasc, Lipitor and Prinzide.  We will bring him back in the next few weeks for clinical reevaluation.  Would check BMET around that time.  2.  Postoperative atrial fibrillation.  He is in sinus rhythm today, also evidence of conduction system disease with prolonged PR interval, right bundle branch block, and left anterior fascicular block.  He was already on atenolol which will be continued, stop Lopressor.  Reduce amiodarone to 200 mg daily.  Continue Eliquis for now.  Continue to track heart rate in case atenolol needs to be reduced.  He is status post surgical Maze and clipping of the left atrial appendage.  3.  Essential hypertension, blood pressure elevated today.  Medication adjustments are being made, hopefully blood pressure will improve with diuresis.  4.  Mixed hyperlipidemia, tolerating Lipitor.  Recent LDL 77, no titration made today.  Medication Adjustments/Labs and Tests Ordered: Current medicines are reviewed at length with the patient today.  Concerns regarding medicines are outlined above.     Tests Ordered: Orders Placed This Encounter  Procedures  . DG Chest 2 View  . EKG 12-Lead    Medication Changes: Meds ordered this encounter  Medications  . amiodarone (PACERONE) 200 MG tablet    Sig: Take 1 tablet (200 mg total) by mouth daily.    Dispense:  90 tablet    Refill:  2    05/21/2020 dose decrease  . furosemide (LASIX) 40 MG tablet    Sig: Take 1 tablet (40 mg total) by mouth daily.    Dispense:  30 tablet    Refill:  3  . potassium chloride (KLOR-CON) 10 MEQ tablet    Sig: Take 1 tablet (10 mEq total) by mouth daily.    Dispense:  30 tablet    Refill:  3    Disposition:  Follow up 2 to 3 weeks.  Signed, Satira Sark, MD, [facility] 05/21/2020 12:07 PM    [facility] [place], [place], [place] [address] Phone: [phone]; Fax: [phone]

## 2020-05-21 NOTE — Patient Instructions (Addendum)
Medication Instructions:   Your physician has recommended you make the following change in your medication:   Decrease amiodarone 200 mg to one by mouth daily  Start furosemide 40 mg by mouth daily  Start potassium chloride 10 meq by mouth daily  Stop metoprolol.  Continue other medications the same  Labwork:  None  Testing/Procedures:  A chest x-ray takes a picture of the organs and structures inside the chest, including the heart, lungs, and blood vessels. This test can show several things, including, whether the heart is enlarges; whether fluid is building up in the lungs; and whether pacemaker / defibrillator leads are still in place.  Follow-Up:  Your physician recommends that you schedule a follow-up appointment in: 2-3 weeks.  Any Other Special Instructions Will Be Listed Below (If Applicable).  If you need a refill on your cardiac medications before your next appointment, please call your pharmacy.

## 2020-05-22 ENCOUNTER — Encounter: Payer: Self-pay | Admitting: Family Medicine

## 2020-05-23 ENCOUNTER — Ambulatory Visit
Admission: RE | Admit: 2020-05-23 | Discharge: 2020-05-23 | Disposition: A | Discharge status: Home / Self Care | Payer: Medicare Other | Source: Ambulatory Visit | Attending: Cardiothoracic Surgery | Admitting: Cardiothoracic Surgery

## 2020-05-23 ENCOUNTER — Telehealth: Payer: Self-pay | Admitting: *Deleted

## 2020-05-23 ENCOUNTER — Other Ambulatory Visit: Payer: Self-pay | Admitting: Cardiothoracic Surgery

## 2020-05-23 ENCOUNTER — Other Ambulatory Visit: Payer: Self-pay

## 2020-05-23 ENCOUNTER — Ambulatory Visit (INDEPENDENT_AMBULATORY_CARE_PROVIDER_SITE_OTHER): Payer: Self-pay | Admitting: Cardiothoracic Surgery

## 2020-05-23 ENCOUNTER — Other Ambulatory Visit: Payer: Self-pay | Admitting: *Deleted

## 2020-05-23 ENCOUNTER — Encounter: Payer: Self-pay | Admitting: Cardiothoracic Surgery

## 2020-05-23 VITALS — BP 126/67 | HR 62 | Temp 97.3°F | Resp 21 | Ht 66.0 in | Wt 173.6 lb

## 2020-05-23 DIAGNOSIS — J90 Pleural effusion, not elsewhere classified: Secondary | ICD-10-CM

## 2020-05-23 DIAGNOSIS — I2510 Atherosclerotic heart disease of native coronary artery without angina pectoris: Secondary | ICD-10-CM

## 2020-05-23 DIAGNOSIS — I469 Cardiac arrest, cause unspecified: Secondary | ICD-10-CM | POA: Diagnosis not present

## 2020-05-23 DIAGNOSIS — R404 Transient alteration of awareness: Secondary | ICD-10-CM | POA: Diagnosis not present

## 2020-05-23 DIAGNOSIS — R4020 Unspecified coma: Secondary | ICD-10-CM | POA: Diagnosis not present

## 2020-05-23 DIAGNOSIS — R0602 Shortness of breath: Secondary | ICD-10-CM | POA: Diagnosis not present

## 2020-05-23 DIAGNOSIS — I499 Cardiac arrhythmia, unspecified: Secondary | ICD-10-CM | POA: Diagnosis not present

## 2020-05-23 NOTE — Telephone Encounter (Signed)
Patient's wife informed and verbalized understanding of plan. 

## 2020-05-23 NOTE — Telephone Encounter (Signed)
Thank you for letting me know that you heard from Dr. Prescott Gum.  I had communicated with him earlier this morning to let him know about the chest x-ray results and I suspect he wants to make sure that the lung fluid is improving on Lasix.  Sometimes, the fluid has to be drawn off if it is not improving quickly enough.  I am glad that he is going to be able to see [facility] today.

## 2020-05-23 NOTE — Progress Notes (Signed)
PCP is Kathyrn Drown, MD Referring Provider is Satira Sark, MD  Chief Complaint  Patient presents with  . Pleural Effusion    with chest xray  . Follow-up    s/p CABG    HPI: Patient presents a month after combined CABG and Maze procedure with atrial clip for severe CAD and recent onset A. fib. He was discharged home with low lung volumes but no significant pleural effusions on Eliquis in a sinus rhythm.  He saw his cardiologist Dr. Domenic Polite and is found to be fluid overloaded and chest x-ray showed a moderate to large left pleural effusion.  He was short of breath with poor exercise tolerance.  His blood pressure was marginal and pulse was 60 sinus by EKG  I reviewed the x-ray and had the patient come to the office today for repeat x-ray and exam.  Today's chest x-ray shows further increase in left pleural effusion.  On exam he had 2+ bilateral leg edema diminished breath sounds bilaterally left greater than right, no JVD, well-healed sternal incision and mild abdominal edema.  I recommended a left thoracentesis in the office and informed consent was performed.  2 L was removed.  Chest x-ray is pending.  Past Medical History:  Diagnosis Date  . CAD (coronary artery disease)    Status post CABG September 2021 - LIMA to LAD, SVG to diagonal, SVG to OM1  . Essential hypertension   . Postoperative atrial fibrillation ([place])   . Type 2 diabetes mellitus ([place])     Past Surgical History:  Procedure Laterality Date  . CLIPPING OF ATRIAL APPENDAGE Left 04/25/2020   Procedure: CLIPPING OF ATRIAL APPENDAGE;  Surgeon: Ivin Poot, MD;  Location: [facility];  Service: Open Heart Surgery;  Laterality: Left;  . CORONARY ARTERY BYPASS GRAFT N/A 04/25/2020   Procedure: CORONARY ARTERY BYPASS GRAFTING (CABG) x 3;  Surgeon: Ivin Poot, MD;  Location: [facility];  Service: Open Heart Surgery;  Laterality: N/A;  . LEFT HEART CATH AND CORONARY ANGIOGRAPHY N/A 04/24/2020   Procedure: LEFT HEART  CATH AND CORONARY ANGIOGRAPHY;  Surgeon: Belva Crome, MD;  Location: [facility];  Service: Cardiovascular;  Laterality: N/A;  . MAZE N/A 04/25/2020   Procedure: MAZE;  Surgeon: Ivin Poot, MD;  Location: [facility];  Service: Open Heart Surgery;  Laterality: N/A;  . No prior surgery    . TEE WITHOUT CARDIOVERSION N/A 04/25/2020   Procedure: TRANSESOPHAGEAL ECHOCARDIOGRAM (TEE);  Surgeon: Prescott Gum, Collier Salina, MD;  Location: [facility];  Service: Open Heart Surgery;  Laterality: N/A;    Family History  Problem Relation Age of Onset  . Hypertension Mother   . Hyperlipidemia Mother   . Heart attack Father        CABG in his 12s    Social History Social History   Tobacco Use  . Smoking status: Never Smoker  . Smokeless tobacco: Never Used  Substance Use Topics  . Alcohol use: Yes    Comment: Occasional  . Drug use: Never    Current Outpatient Medications  Medication Sig Dispense Refill  . amiodarone (PACERONE) 200 MG tablet Take 1 tablet (200 mg total) by mouth daily. 90 tablet 2  . amLODipine (NORVASC) 5 MG tablet Take 1 tablet (5 mg total) by mouth daily. 30 tablet 5  . apixaban (ELIQUIS) 5 MG TABS tablet Take 1 tablet (5 mg total) by mouth 2 (two) times daily. 60 tablet 0  . ascorbic acid (VITAMIN C)  500 MG tablet Take 500 mg by mouth every other day.    Marland Kitchen aspirin 81 MG tablet Take 81 mg by mouth daily.    Marland Kitchen atenolol (TENORMIN) 50 MG tablet TAKE 1 TABLET BY MOUTH TWICE DAILY (Patient taking differently: Take 50 mg by mouth 2 (two) times daily. ) 180 tablet 0  . atorvastatin (LIPITOR) 80 MG tablet Take 1 tablet (80 mg total) by mouth daily. 30 tablet 11  . cholecalciferol (VITAMIN D3) 25 MCG (1000 UNIT) tablet Take 1,000 Units by mouth every other day.    . clonazePAM (KLONOPIN) 0.5 MG tablet Take 1 tablet nightly as needed for insomnia 30 tablet 0  . Emollient (DERMEND BRUISE FORMULA EX) Apply 1 application topically every other day.    Marland Kitchen EPINEPHrine 0.3 mg/0.3 mL IJ SOAJ  injection Inject 0.3 mg into the muscle as needed for anaphylaxis.     . furosemide (LASIX) 40 MG tablet Take 1 tablet (40 mg total) by mouth daily. 30 tablet 3  . insulin glargine (LANTUS SOLOSTAR) 100 UNIT/ML Solostar Pen Inject 6 units into skin q evening. May titrate up to 30 units per provider discretion. 15 mL 4  . JANUVIA 100 MG tablet TAKE 1 TABLET BY MOUTH EVERY DAY 30 tablet 1  . lansoprazole (PREVACID) 15 MG capsule Take 15 mg by mouth daily as needed (heartburn).    Marland Kitchen lisinopril-hydrochlorothiazide (ZESTORETIC) 20-25 MG tablet TAKE 1 TABLET BY MOUTH EVERY DAY (Patient taking differently: Take 1 tablet by mouth daily. ) 90 tablet 0  . metFORMIN (GLUCOPHAGE) 1000 MG tablet TAKE 1 TABLET BY MOUTH EVERY DAY (Patient taking differently: Take 1,000 mg by mouth daily. ) 90 tablet 0  . potassium chloride (KLOR-CON) 10 MEQ tablet Take 1 tablet (10 mEq total) by mouth daily. 30 tablet 3  . vitamin B-12 (CYANOCOBALAMIN) 1000 MCG tablet Take 1,000 mcg by mouth every other day.     Current Facility-Administered Medications  Medication Dose Route Frequency Provider Last Rate Last Admin  . nitroGLYCERIN (NITROSTAT) SL tablet 0.4 mg  0.4 mg Sublingual Q5 min PRN Belva Crome, MD        Allergies  Allergen Reactions  . Yellow Jacket Venom [Bee Venom] Hives and Shortness Of Breath    Review of Systems  No angina Short of breath Positive for dizziness Poor appetite Weight gain proxy 1 pound per day for the past 6 days Ankle and abdominal swelling BP 126/67 (BP Location: Left Arm, Patient Position: Sitting, Cuff Size: Normal)   Pulse 62   Temp (!) 97.3 F (36.3 C) (Skin)   Resp (!) 21   Ht 5\' 6"  (1.676 m)   Wt 173 lb 9.6 oz (78.7 kg)   SpO2 92% Comment: RA  BMI 28.02 kg/m  Physical Exam Alert and responsive no distress Heart rate regular 60 to 65/min Diminished breath sounds at left base Sternal and leg incisions well-healed Bilateral 2+ ankle edema No JVD  Diagnostic  Tests: Chest x-ray with moderate to large left pleural effusion  Impression: Left thoracentesis performed removing 2 L of bloody fluid  Plan: Hold Eliquis until office visit next week as patient is in sinus rhythm and the Eliquis is contributed to his left pleural effusion.  Continue aspirin 81 mg  Reduce Tenormin-atenolol to once a day due to bradycardia Continue with Lasix and potassium Patient will start on Mylicon 3 times a day for abdominal gas which is hurting his lung volumes  I will see him back in 6 days  with repeat chest x-ray  [name], MD [facility] [phone]

## 2020-05-23 NOTE — Telephone Encounter (Signed)
-----   Message from Satira Sark, MD sent at 05/15/2020  8:27 AM EDT ----- Thank you for the communication.  Interestingly, it does not appear that this chest x-ray has a formal read by radiology (and I never got a final report sent to my inbox).  I pulled up the images myself, there are significant bilateral pleural effusions, left greater than right which is consistent with his exam and my concern at office encounter.  I already started him back on Lasix.  He has a visit to see Dr. Prescott Gum next week.  He may even need to have the left side tapped if this does not continue to improve.  I did make other medication adjustments and hopefully he will start to feel better. ----- Message ----- From: Kathyrn Drown, MD Sent: 05/22/2020   9:56 PM EDT To: Satira Sark, MD  His wife message me with how he is doing  I was able to see your note  I pulled up the images of his chest x-ray and it shows increased loss of lung volume on the left side. Not sure if this could be pleural effusion?  I appreciate your care. Please look at this x-ray to see if you feel this patient needs further evaluation for this issue because I can help believe if this continues it could impact his breathing.  Thanks-Scott

## 2020-05-27 DIAGNOSIS — 419620001 Death: Secondary | SNOMED CT | POA: Diagnosis not present

## 2020-05-27 DEATH — deceased

## 2020-05-29 ENCOUNTER — Ambulatory Visit: Payer: Medicare Other | Admitting: Cardiothoracic Surgery

## 2020-06-05 ENCOUNTER — Ambulatory Visit: Payer: Medicare Other | Admitting: Cardiology

## 2020-06-26 DEATH — deceased

## 2020-07-15 ENCOUNTER — Ambulatory Visit: Payer: Medicare Other | Admitting: Family Medicine

## 2022-02-13 IMAGING — DX DG CHEST 2V
2 series · 2 of 2 positions shown · non-contrast
Comparison: 04/28/2020

CLINICAL DATA: History of CABG.

EXAM:
CHEST - 2 VIEW

[chest pa]
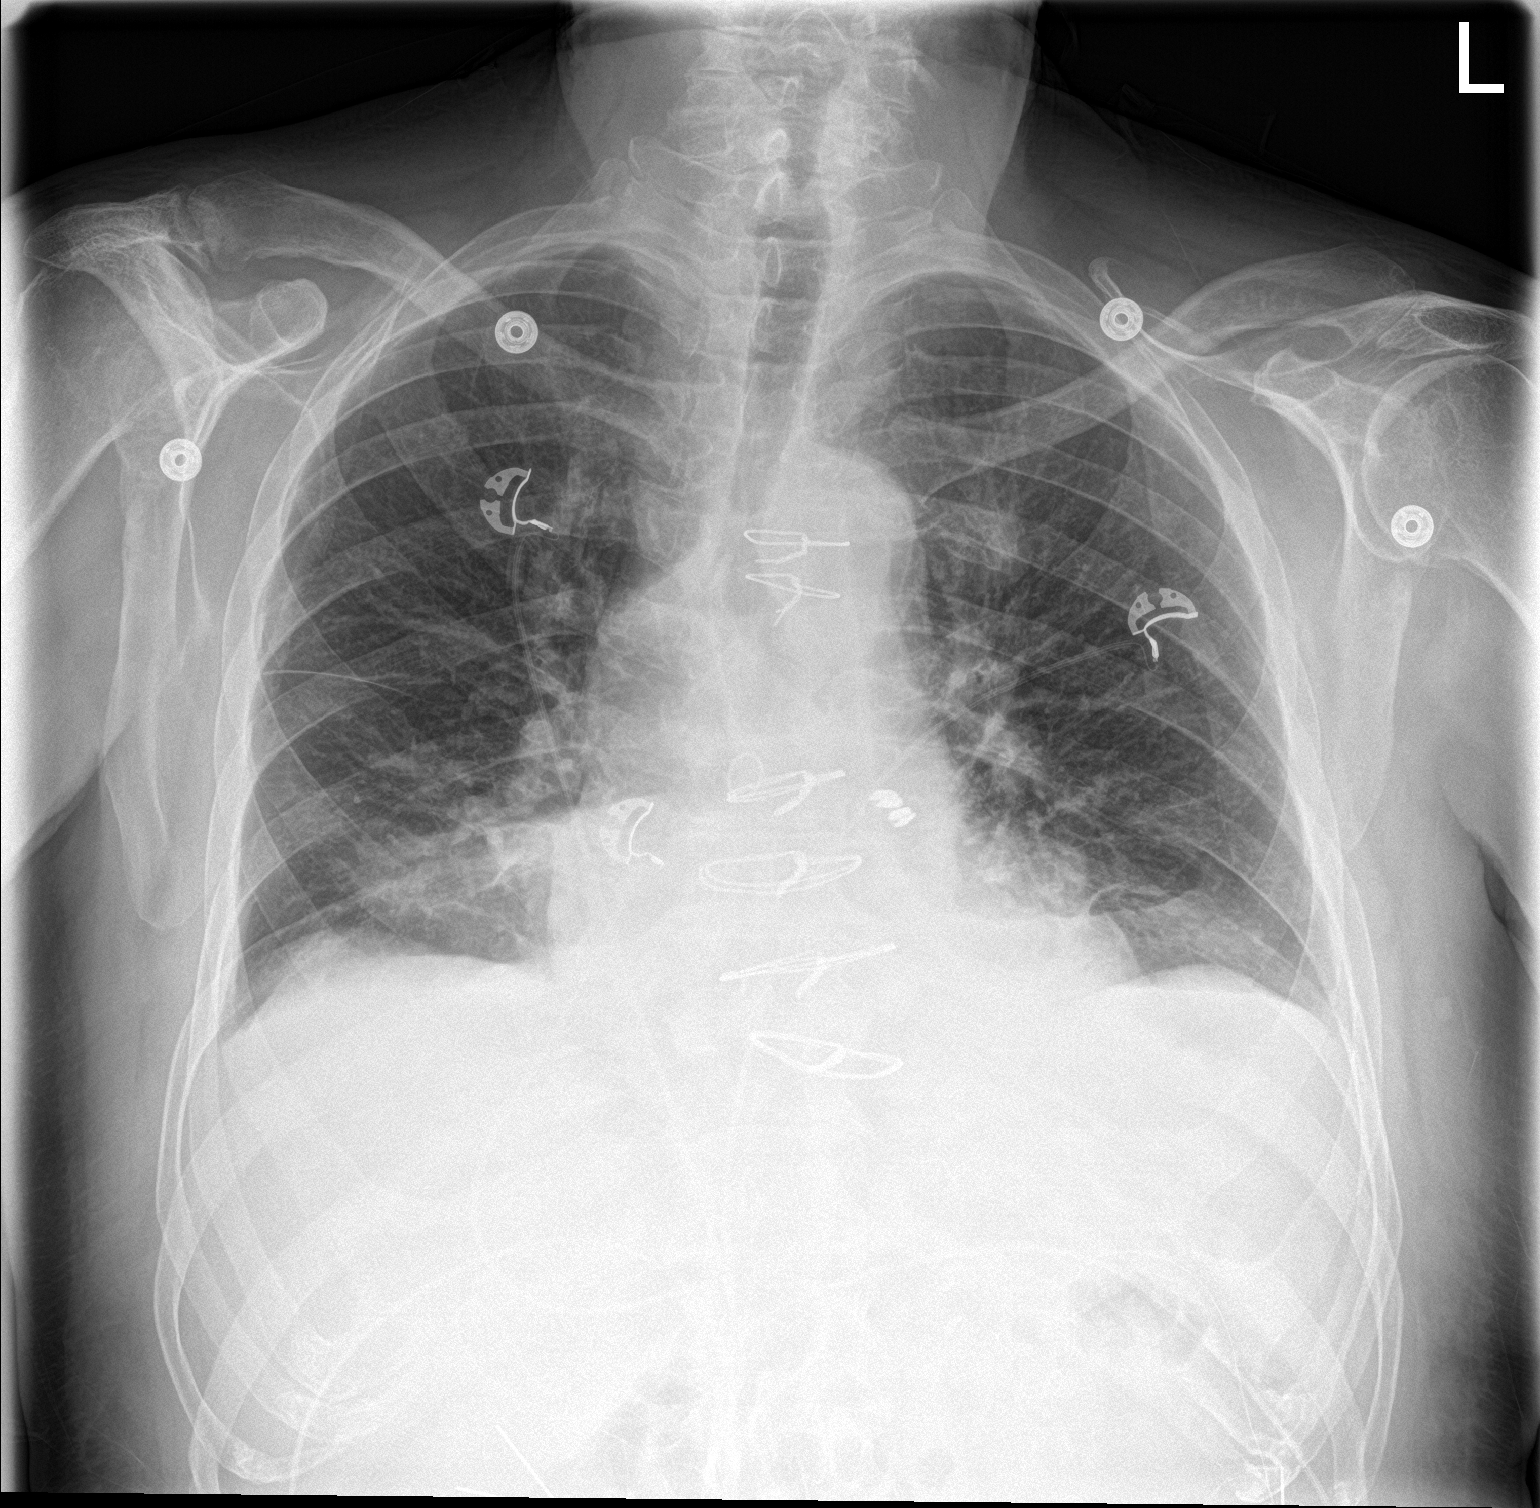

[chest lat]
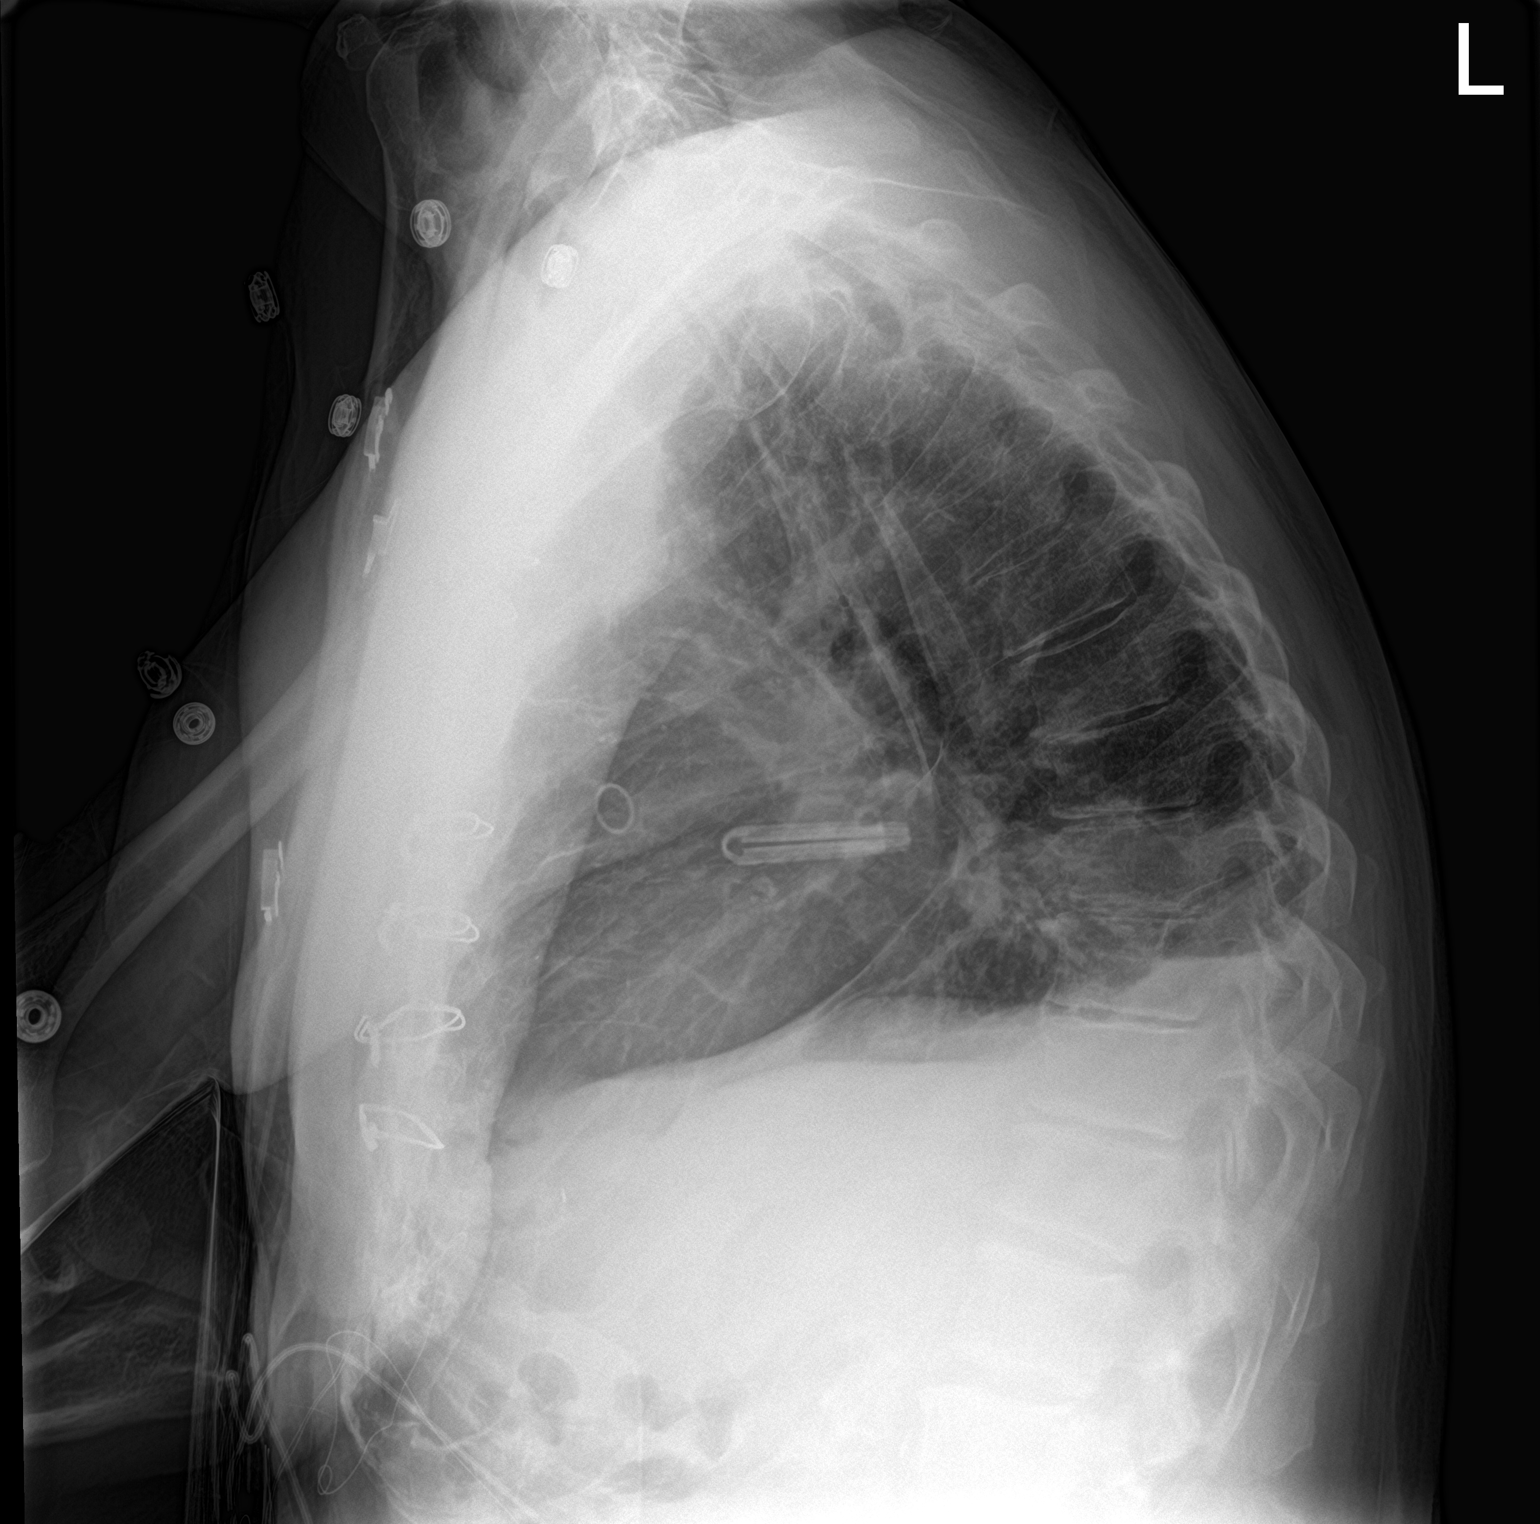

[2 of 2 positions shown; findings below may reference images not displayed]

FINDINGS: 6666 hours. Low volume film. Persistent bibasilar
collapse/consolidative opacity. No pulmonary edema. The visualized
bony structures of the thorax show no acute abnormality. Telemetry
leads overlie the chest.
IMPRESSION: Persistent bibasilar collapse/consolidation.

## 2022-03-08 IMAGING — DX DG CHEST 2V SAME DAY
2 series · 2 of 2 positions shown · non-contrast
Comparison: Exam at 6110 hours compared to earlier study of [DATE]
hours

CLINICAL DATA: Post LEFT thoracentesis

EXAM:
CHEST - 2 VIEW SAME DAY

[dg chest 2v repeat same day (1 of 2)]
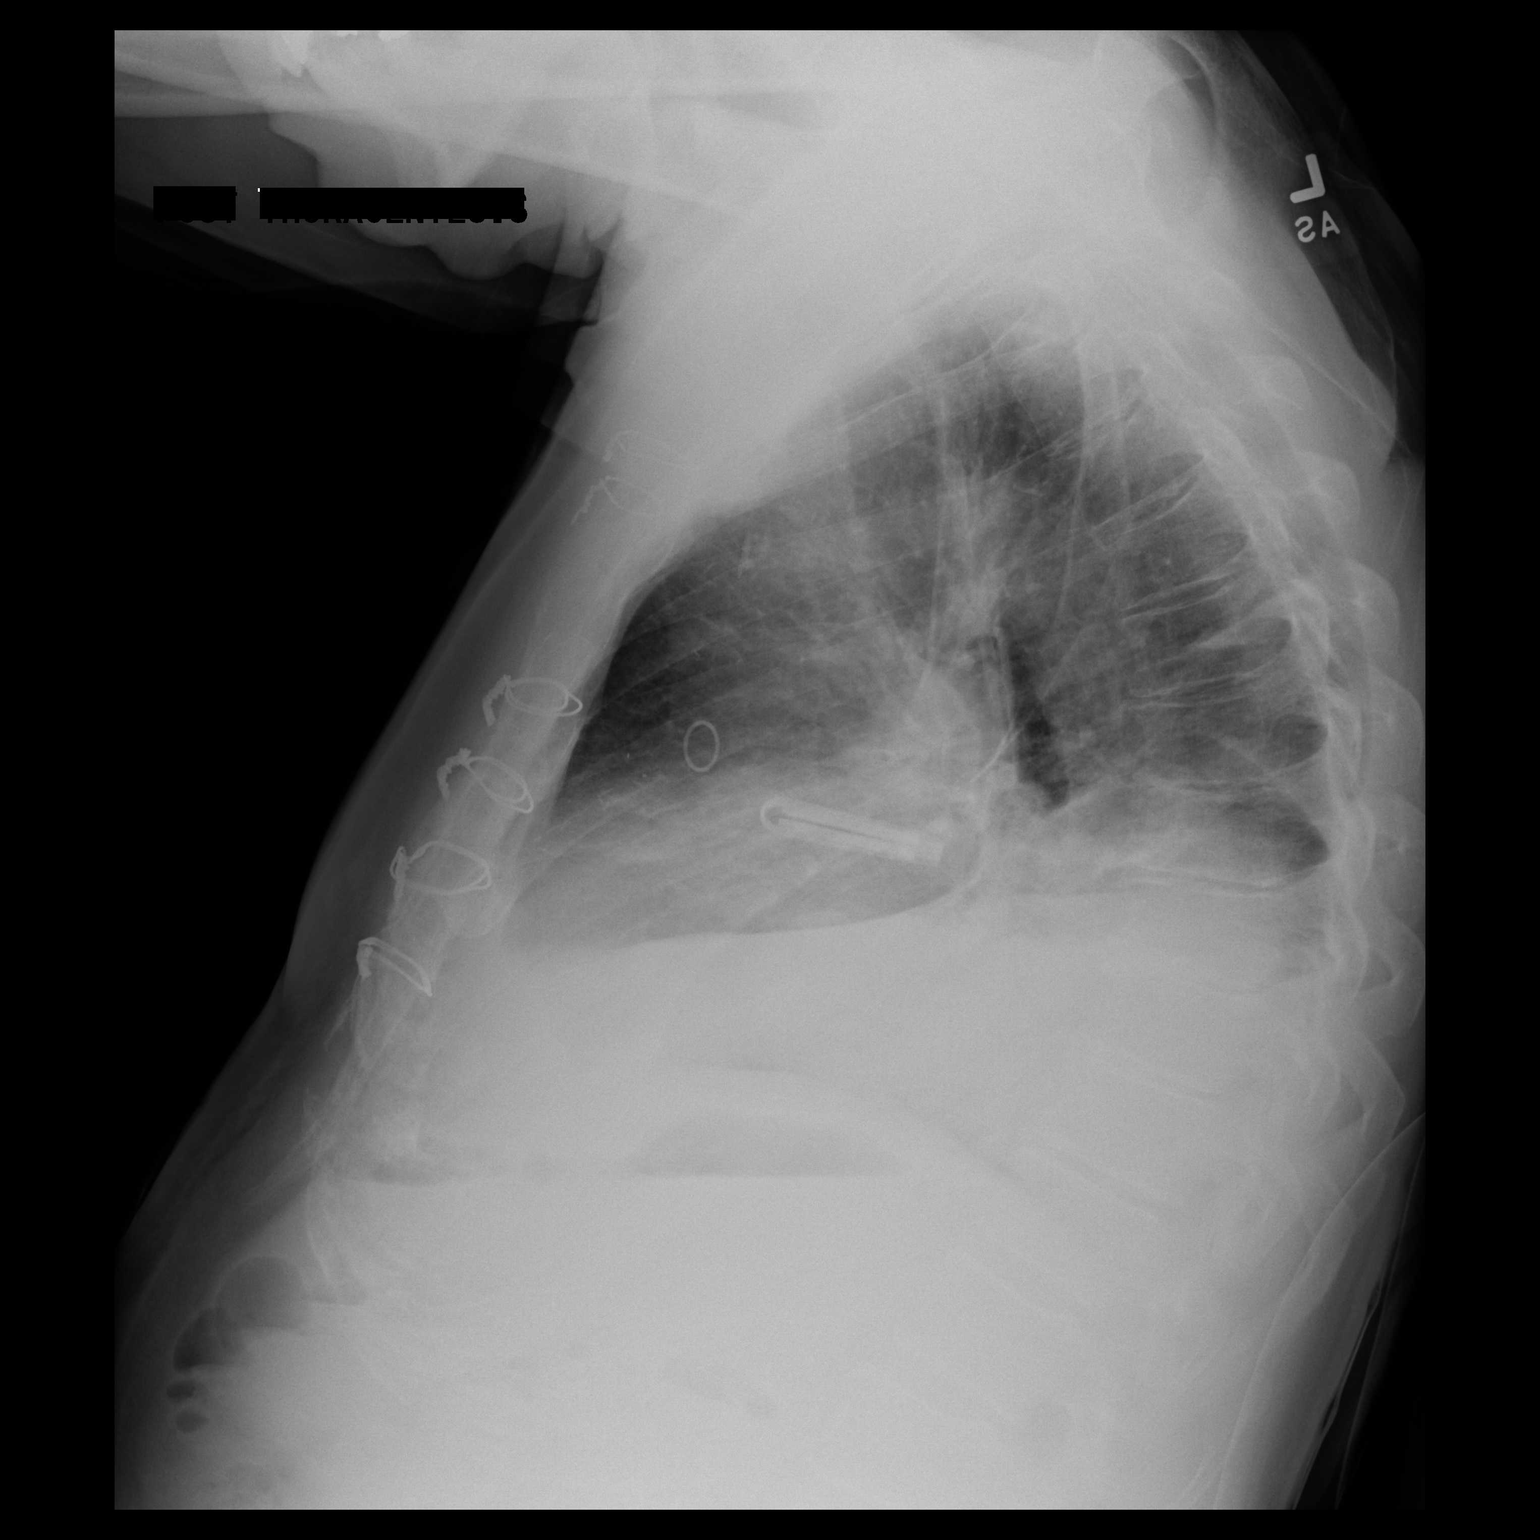

[dg chest 2v repeat same day (2 of 2)]
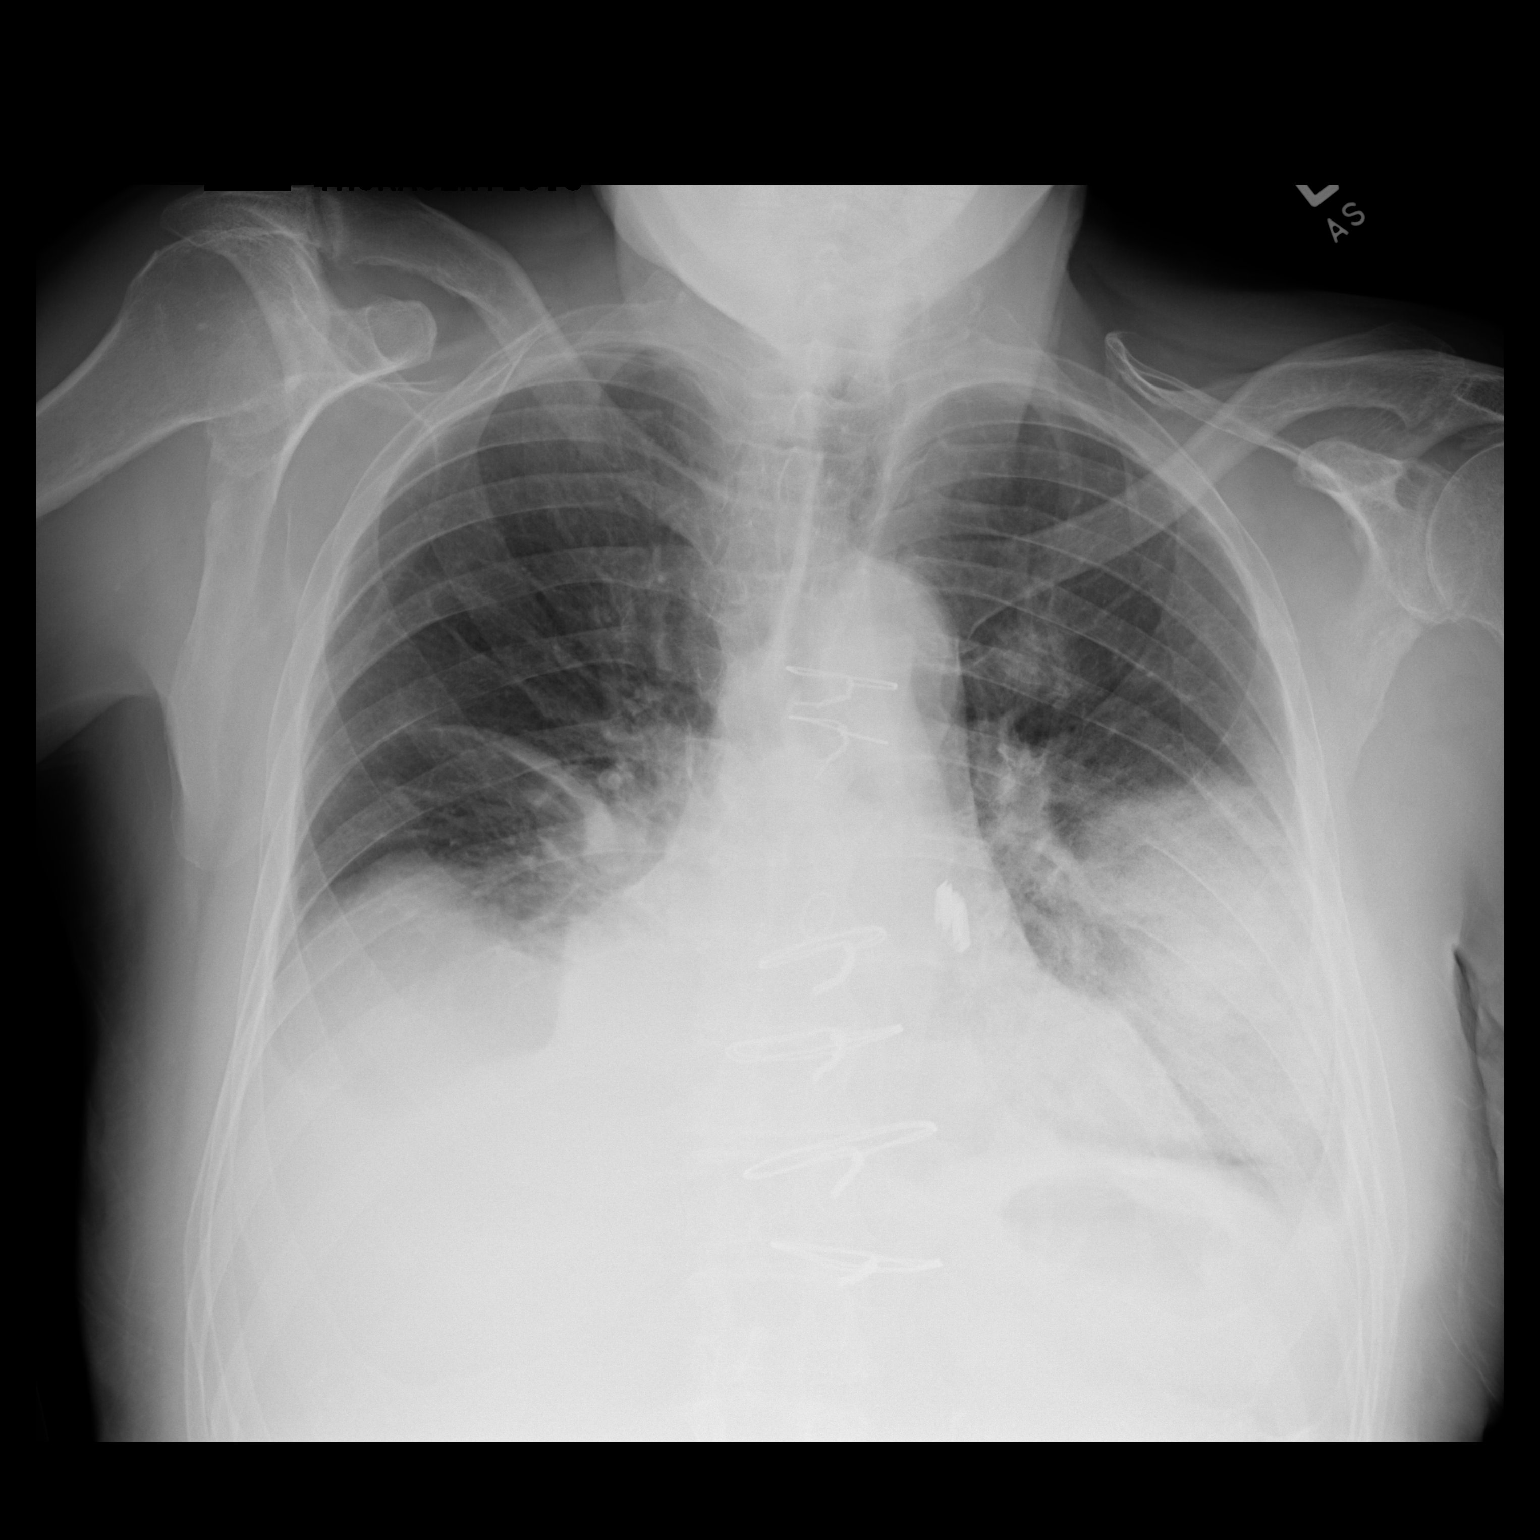

[2 of 2 positions shown; findings below may reference images not displayed]

FINDINGS: Upper normal heart size post CABG and Maze procedure.

Mediastinal contours and pulmonary vascularity normal.

Markedly decreased LEFT pleural effusion versus prior exam.

Residual atelectasis versus consolidation in LEFT lower lobe.

Persistent small RIGHT pleural effusion and RIGHT basilar
atelectasis.

Upper lungs clear.

No pneumothorax following LEFT thoracentesis.
IMPRESSION: No pneumothorax following LEFT thoracentesis.

Persistent small RIGHT pleural effusion and basilar atelectasis as
well as atelectasis versus consolidation of LEFT lower lobe.
